# Patient Record
Sex: Female | Born: 1965 | Race: White | Hispanic: No | Marital: Single | Smoking: Never smoker
Health system: Southern US, Community
[De-identification: ages and names within clinical notes are randomized; demographics above are authoritative.]

## PROBLEM LIST (undated history)

## (undated) ENCOUNTER — Emergency Department (HOSPITAL_COMMUNITY): Admission: EM | Discharge status: Absent without leave | Payer: BC Managed Care – PPO

## (undated) DIAGNOSIS — G43909 Migraine, unspecified, not intractable, without status migrainosus: Secondary | ICD-10-CM

## (undated) DIAGNOSIS — Z975 Presence of (intrauterine) contraceptive device: Secondary | ICD-10-CM

## (undated) DIAGNOSIS — D649 Anemia, unspecified: Secondary | ICD-10-CM

## (undated) DIAGNOSIS — R7303 Prediabetes: Secondary | ICD-10-CM

## (undated) DIAGNOSIS — C439 Malignant melanoma of skin, unspecified: Secondary | ICD-10-CM

## (undated) DIAGNOSIS — E039 Hypothyroidism, unspecified: Secondary | ICD-10-CM

## (undated) HISTORY — DX: Hypothyroidism, unspecified: E03.9

## (undated) HISTORY — DX: Prediabetes: R73.03

## (undated) HISTORY — DX: Anemia, unspecified: D64.9

## (undated) HISTORY — PX: KNEE SURGERY: SHX244

## (undated) HISTORY — DX: Presence of (intrauterine) contraceptive device: Z97.5

## (undated) HISTORY — PX: HAND SURGERY: SHX662

## (undated) HISTORY — PX: OTHER SURGICAL HISTORY: SHX169

## (undated) HISTORY — DX: Malignant melanoma of skin, unspecified: C43.9

## (undated) HISTORY — DX: Migraine, unspecified, not intractable, without status migrainosus: G43.909

---

## 1998-03-21 ENCOUNTER — Other Ambulatory Visit: Admission: RE | Admit: 1998-03-21 | Discharge: 1998-03-21 | Payer: Self-pay | Admitting: Obstetrics & Gynecology

## 1999-08-17 ENCOUNTER — Ambulatory Visit (HOSPITAL_COMMUNITY): Admission: RE | Admit: 1999-08-17 | Discharge: 1999-08-17 | Payer: Self-pay | Admitting: Obstetrics & Gynecology

## 1999-10-24 ENCOUNTER — Other Ambulatory Visit: Admission: RE | Admit: 1999-10-24 | Discharge: 1999-10-24 | Payer: Self-pay | Admitting: Obstetrics & Gynecology

## 2000-11-29 ENCOUNTER — Other Ambulatory Visit: Admission: RE | Admit: 2000-11-29 | Discharge: 2000-11-29 | Payer: Self-pay | Admitting: Obstetrics & Gynecology

## 2001-12-18 ENCOUNTER — Other Ambulatory Visit: Admission: RE | Admit: 2001-12-18 | Discharge: 2001-12-18 | Payer: Self-pay | Admitting: Obstetrics & Gynecology

## 2003-12-07 ENCOUNTER — Ambulatory Visit (HOSPITAL_BASED_OUTPATIENT_CLINIC_OR_DEPARTMENT_OTHER): Admission: RE | Admit: 2003-12-07 | Discharge: 2003-12-07 | Payer: Self-pay | Admitting: Orthopedic Surgery

## 2004-01-03 ENCOUNTER — Other Ambulatory Visit: Admission: RE | Admit: 2004-01-03 | Discharge: 2004-01-03 | Payer: Self-pay | Admitting: Physician Assistant

## 2005-02-19 ENCOUNTER — Other Ambulatory Visit: Admission: RE | Admit: 2005-02-19 | Discharge: 2005-02-19 | Payer: Self-pay | Admitting: Gynecology

## 2006-04-08 ENCOUNTER — Other Ambulatory Visit: Admission: RE | Admit: 2006-04-08 | Discharge: 2006-04-08 | Payer: Self-pay | Admitting: Gynecology

## 2006-12-09 ENCOUNTER — Ambulatory Visit (HOSPITAL_COMMUNITY): Admission: RE | Admit: 2006-12-09 | Discharge: 2006-12-10 | Payer: Self-pay | Admitting: Orthopedic Surgery

## 2007-05-21 ENCOUNTER — Other Ambulatory Visit: Admission: RE | Admit: 2007-05-21 | Discharge: 2007-05-21 | Payer: Self-pay | Admitting: Gynecology

## 2007-07-24 ENCOUNTER — Encounter: Admission: RE | Admit: 2007-07-24 | Discharge: 2007-07-24 | Payer: Self-pay | Admitting: Gynecology

## 2010-01-06 IMAGING — MG MM SCREEN MAMMOGRAM BILATERAL
4 series · 4 of 4 positions shown · non-contrast
Comparison: none

DG SCREEN MAMMOGRAM BILATERAL
Bilateral CC and MLO view(s) were taken.

DIGITAL SCREENING MAMMOGRAM WITH CAD:
There are scattered fibroglandular densities.  No masses or malignant type calcifications are 
identified.  Compared with prior studies.

[R CC]
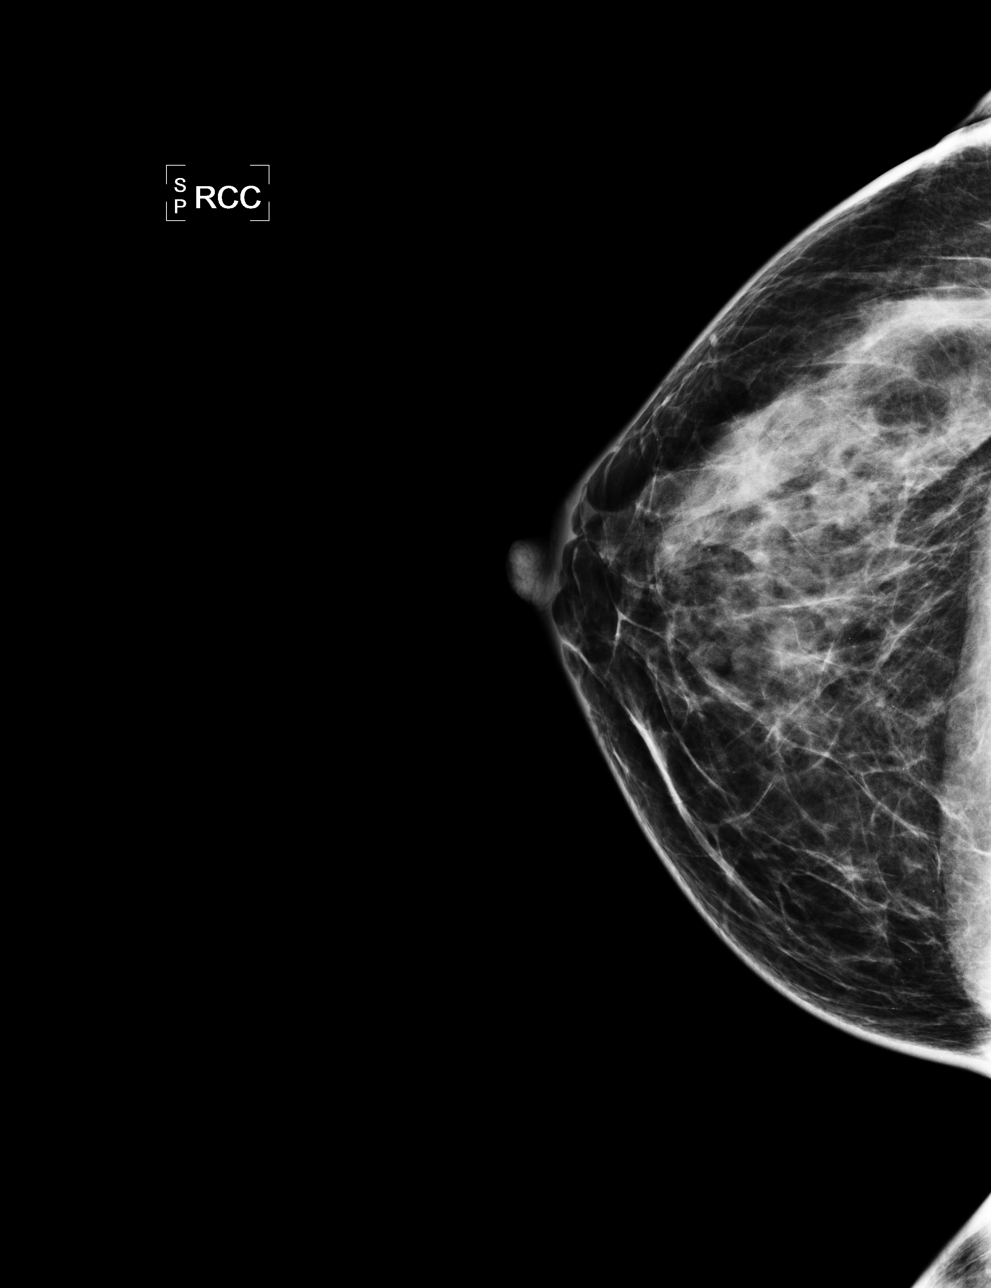

[L CC]
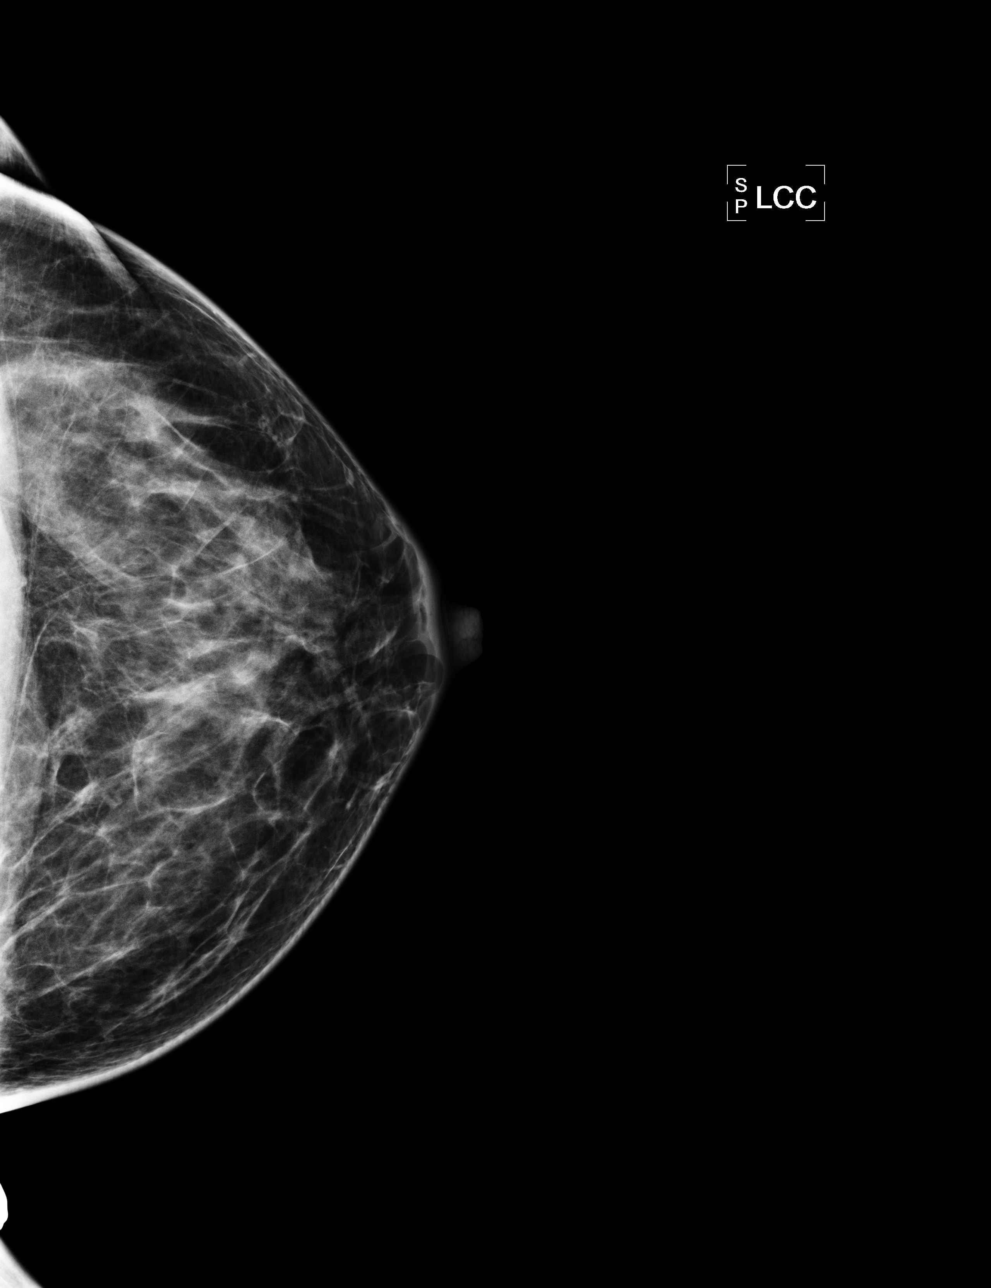

[L MLO]
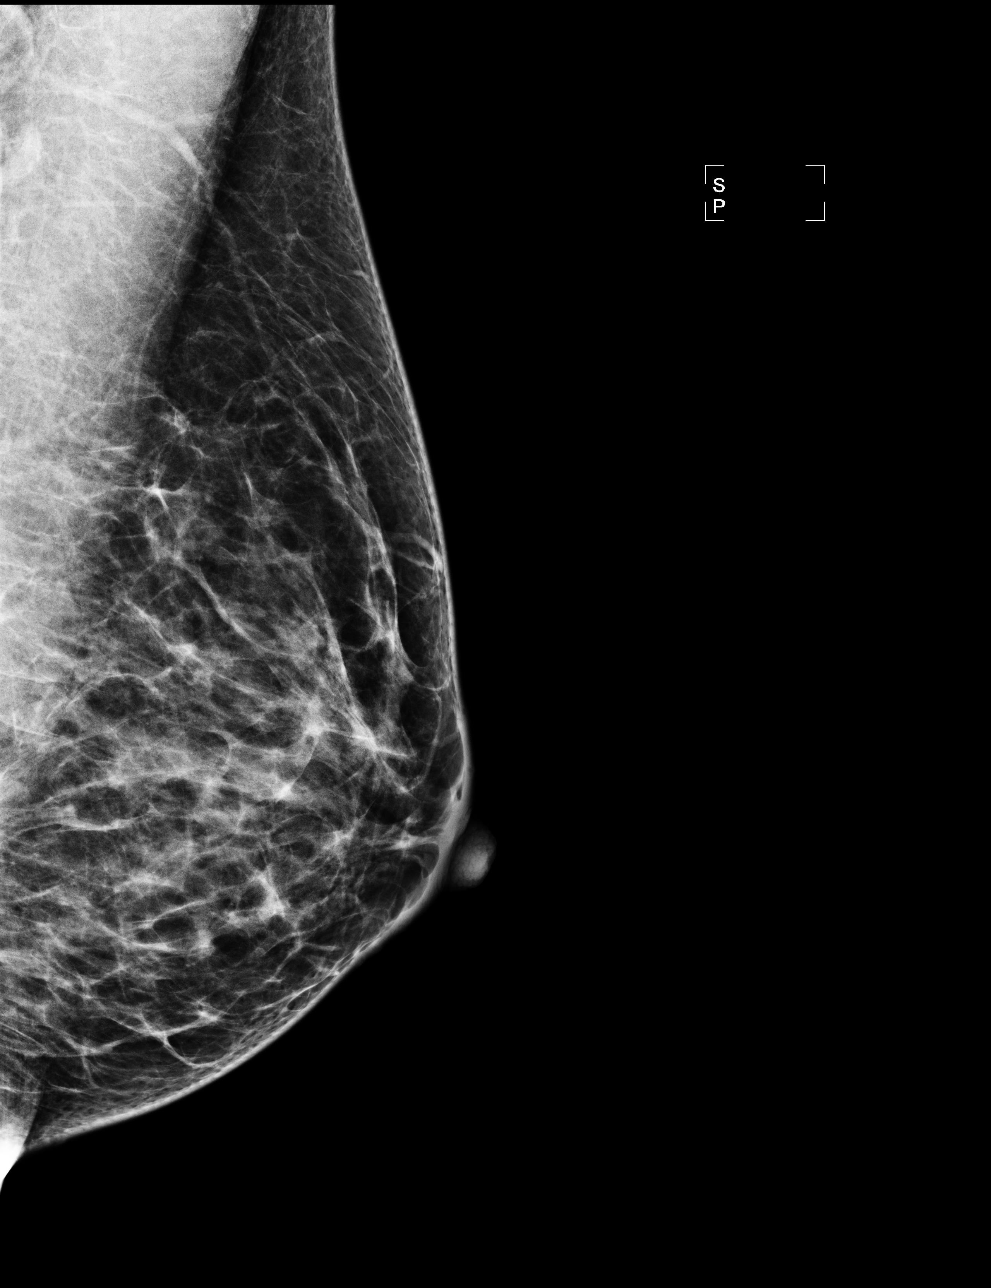

[R MLO]
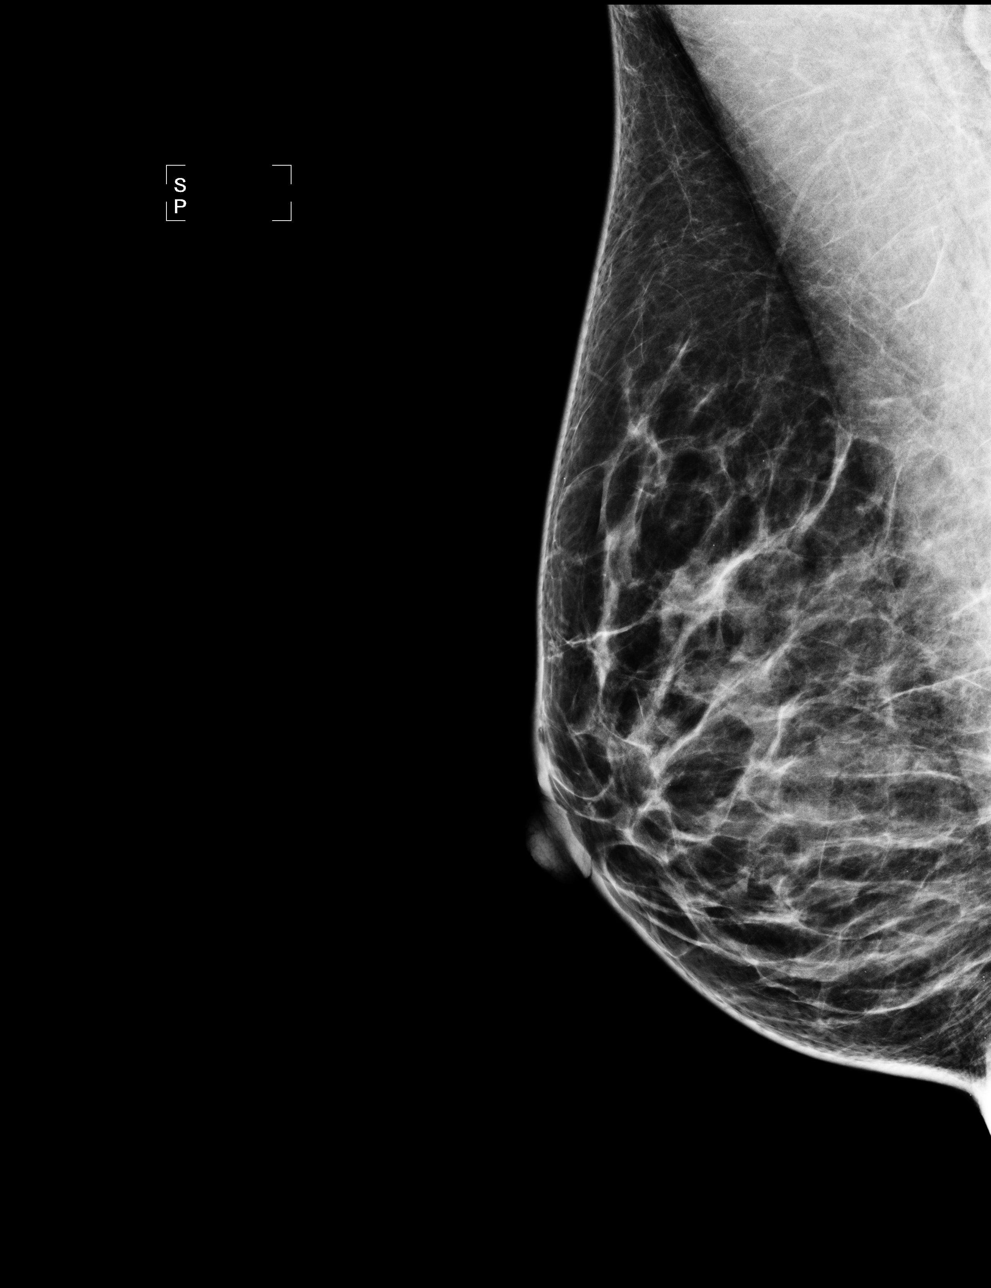

[4 of 4 positions shown; findings below may reference images not displayed]

IMPRESSION: No specific mammographic evidence of malignancy.  Next screening mammogram is recommended in one 
year.

ASSESSMENT: Negative - BI-RADS 1

Screening mammogram in 1 year.
ANALYZED BY COMPUTER AIDED DETECTION. , THIS PROCEDURE WAS A DIGITAL MAMMOGRAM.

## 2010-04-24 ENCOUNTER — Other Ambulatory Visit (HOSPITAL_COMMUNITY)
Admission: RE | Admit: 2010-04-24 | Discharge: 2010-04-24 | Disposition: A | Discharge status: Home / Self Care | Payer: Self-pay | Source: Ambulatory Visit | Attending: Family Medicine | Admitting: Family Medicine

## 2010-04-24 ENCOUNTER — Other Ambulatory Visit: Payer: Self-pay | Admitting: Family Medicine

## 2010-04-24 DIAGNOSIS — Z124 Encounter for screening for malignant neoplasm of cervix: Secondary | ICD-10-CM | POA: Insufficient documentation

## 2010-06-20 NOTE — Op Note (Signed)
Wendy Whitaker, ATEN              ACCOUNT NO.:  [id number]   MEDICAL RECORD NO.:  [id number]          PATIENT TYPE:  INP   LOCATION:  0002                         FACILITY:  [facility]   PHYSICIAN:  Madlyn Frankel. Charlann Boxer, M.D.  DATE OF BIRTH:  06/04/1965   DATE OF PROCEDURE:  12/09/2006  DATE OF DISCHARGE:                               OPERATIVE REPORT   PREOPERATIVE DIAGNOSIS:  Right knee medial compartment osteoarthritis.  Retained hardware right knee.  Widened scar medial aspect right knee   POSTOPERATIVE DIAGNOSIS:  Right knee medial compartment osteoarthritis.  same   PROCEDURE:  Right knee partial knee replacement.  Removal superficial  implant right knee.  Scar revision 12-15cm in length.   INSTRUMENTS USED:  Biomet Oxford knee system, size small femur, size  right A tibia, size 4 right medial insert.   SURGEON:  Madlyn Frankel. Charlann Boxer, M.D.   ASSISTANT:  Yetta Glassman. Loreta Ave, [place]   ANESTHESIA:  Derm or spinal.   COMPLICATIONS:  None.   DRAINS:  Times 1.   TOURNIQUET TIME:  70 minutes at 250 mmHg.   INDICATIONS FOR PROCEDURE:  Wendy Whitaker is a 45 year old who had history of  right knee injury playing soccer approximately 25 years ago; 22 years  ago, she had reconstruction of her PCL and MCL.  Over the years she has  had progressive worsening of her knee pain with radiographic changes of  end-stage medial compartment arthritis.  This was significantly limiting  some of her functional quality-of-life, though she had tried to remain  as active as possible.  She had failed conservative measures of  injections, anti-inflammatories, viscous supplementation--all providing  no significant long-term relief.  We discussed the surgical options in a  45 year old, active, female; and a partial knee replacement was a very  good option, based on the location of her pain.   She also had a retained surgical button from previous surgery that was  bugging her every time it got bumped.  She wished to have  that  removed.   We also discussed, preoperatively, revising her old scar.  There was a  pretty extensive approach to her previous surgery with some widening of  the scar tissue, and she wanted that excised.   Consent was obtained after reviewing risks and benefits.  <PROCEDURE IN DETAIL/>  The patient was brought to the operative theater.  Once adequate  anesthesia, preoperative antibiotics, Ancef administered, the patient  was positioned supine with her leg in a proximal thigh tourniquet.  She  was then positioned on the lateral side of the table, and had her  operative leg flexed and abducted at the hip, and placed into the Oxford  leg holder.  I checked to make sure that she had an adequate and full  range of motion that I needed for the operation prior to draping.  At  this point, her knee was prescrubbed, and prepped and draped in a  sterile fashion.   The extent of her old incision was identified and outlined.  The leg was  exsanguinated and tourniquet elevated.  Sharp dissection was carried  through her scar  to revise the scar, carried down to the subcutaneous  fat layer.  Then distally, I extended it down to remove the retained  button.  Creating small skin flaps, I was able to remove the button  without issue.  There was some flattening of the bone in this area that  I contoured down with a rasp and use of a Theatre manager.   At this point, attention was now turned to the knee.  A median  parapatellar arthrotomy was created, approximately 1-2 cm above the  proximal fold of the patella, extending distally.  She was noted to have  scar tissue on the medial side of the knee, and I could not perform a  big peel off the proximal medial tibia per protocol, and we released a  little bit off of the proximal edge further delineating the extensive  osteophytes that were noted there preoperatively.  With the knee exposed  including some synovectomy and removal of some of the  retropatellar fat  pad, I was able to now remove the osteophytes.  Attention was now  directed to removing osteophytes on the medial femoral condyle as well  is in the notch, and a small osteophyte noted on the medial aspect of  the patella.   Following the removal of the osteophytes and exposure of the knee, the  extramedullary __________  was placed on to the tibia.  A perpendicular  cut was made off the proximal tibia, removing what appeared to be  probably 3 mm of bone from the most arthritic segment of her tibia.  Reciprocating saw was used first off the medial aspect of the tibial  spine, followed by an oscillating saw to remove this segment.  The  patient was noted to have has significant anterior medial wear pattern  with preserved posterior medial cartilage.  This cut did remove a  significant portion of her osteophytes.  I did use a Baer rongeur to  remove further osteophytes along the medial side of the knee.   At this point, I checked with a size A tibial component and the 4 feeler  gauge, and the 4 slid in nicely with very little flexion-extension  indicating that it was the right size.  At this point, I used a starting  awl which helped to create a hole in the femoral femur 1 cm to the  medial, and proximal to the medial notch.  I then passed an  intramedullary rod without difficulty.  I did irrigate and suction the  canal to prevent fat embolism.  With this rod in place, I placed the jig  for drill holes for the posterior cutting block.  Once I had this  position for a size small femur, in the middle of the medial femoral  condyle, and oriented in all planes as appropriate for technique; drill  holes were made for the posterior cutting block.   The posterior cutting block was placed, and posterior cut made.  This  did remove some of the osteophytes that had been previously noted on her  lateral radiograph.  She had significant posterior medial osteophyte  that needed to  debrided.  At this point, following this cut, I placed  the 0 spigot, and milled down the femur to put the trial in.  With the  trial small femur in place, I did remove more osteophytes from the  medial side of the knee as had been noted from the milling.  In flexion,  the size 4 fit very  nicely, and in 20 degrees of flexion I liked the  size 1.  The size 2 was too tight.  For this reason, I went ahead and  placed the respigot in place and 1 milled down.  At this point, I used  the rongeur to remove the central notch and central area bone, and then  the osteotomes removed the remaining osteophytes and bone from the  milling.  I then redid a trial.  I was very happy with a size 4 with the  feeler  gauge in both flexion-extension of the knee, it came out to full  extension.  At this point, it was stable with a couple of millimeters of  play when testing the medial and collateral ligament at 20 degrees  flexion.   At this point, I removed all trial components and used the posterior  osteotome to remove the osteophyte.  This was obviously palpable, and  upon use of the chiller, the chisel, I was able to remove this  osteophyte, and could feel a significant difference.  I removed the  osteophytes with a curette and pituitary rongeur.   There appeared to be a small bit of calcification within the capsule.  I  did not go after this due to the location.  At this point, final  preparation of the tibia was carried out with a size 8 tibial tray held  in position.  I then pinned it and used a reciprocating saw to create  the channel.  I used the tibial gouge to remove this bone.  With the  keel trial in place, and a size small femur, I trialed with a 4-lipped  poly.  Again, the knee felt very stable to me from extension-to-flexion.  In 20 degrees of extension she had a little bit of play at the medial  side of knee as per protocol.  I did not think that I was overstuffing  her with this.   At  this point, all trial components were removed.  I used a small drill  to drill holes into the sclerotic femur and proximal tibia.  I then  irrigated the wound and may my final debridements.  Please note that a  large piece of meniscus was removed during the procedure.  At this  point, the cement was mixed in a 2-stage technique with the tibia side  first.  Once the cement was ready, and final components were open, the  tibia components were cemented in place with a trial femur placed with  the 4 feeler gauge.  The knee was brought to 45 degrees of flexion, held  with pressure, and extruded cement was removed.  After approximately 7  minutes a second batch of cement was made for the femur.  The femoral  component was cemented into position, size small, and again held with a  4 feeler gauge at 45 degrees of flexion.  Excessive cement was removed.  Once the cement had cured and the excessive cement removed, with the use  of the console, I re-examined the posterior aspect of the knee; and felt  that there was no significant amount of cement that needed to be  debrided.  On the medial side of the knee there was no cement that was  visualized.  Once I was satisfied that this was it, I retrialed and was  satisfied with a size 4 insert.  Again, it popped in appropriately; and  was stable from extension-to-flexion at 20 degrees of extension and 1-2  mm of play with valgus loading that indicated for the procedure.  This  brought the final 4 small right medial insert placed without difficulty.  The knee was irrigated throughout the case; and, again, at this point.  I injected the knee with 30 mL of 1/4% Marcaine with epinephrine and 1  mL of Toradol.  The medium Hemovac drain was placed deep.  Tourniquet  was let down at 70 minutes.  The extensor mechanism was then  reapproximated using #1 Vicryl.  I reapproximated the subcu layers with  a 3-0 Vicryl, and then used the 4-0 Monocryl to close up the  scar  revision.   At the end of the procedure the wound was clean, dry, and dressed  sterilely with Steri-Strips and a sterile dressing.  She was brought to  the recovery room in a knee immobilizer with ice, in stable condition.      Madlyn Frankel Charlann Boxer, M.D.  Electronically Signed     MDO/MEDQ  D:  12/09/2006  T:  12/09/2006  Job:  [id number]

## 2010-06-23 NOTE — H&P (Signed)
[name], [name]              ACCOUNT NO.:  [id number]   MEDICAL RECORD NO.:  [id number]         PATIENT TYPE:  LINP   LOCATION:                               FACILITY:  [facility]   PHYSICIAN:  Madlyn Frankel. Charlann Boxer, M.D.  DATE OF BIRTH:  03/15/1965   DATE OF ADMISSION:  12/09/2006  DATE OF DISCHARGE:                              HISTORY & PHYSICAL   PROCEDURE:  Left unicondylar knee arthroplasty.   CHIEF COMPLAINTS:  Right knee pain.   HISTORY OF PRESENT ILLNESS:  [name] is a very pleasant 45 year old  female with a history persistent progressive right knee pain secondary  to osteoarthritis with a history of multi-ligament repair with  meniscectomy in the past.  She had been refractory to all conservative  treatments included anti-inflammatories, cortisone injections and  viscous supplementation.  She is a very healthy female and has been  assessed and cleared for surgery prior by Dr. Charlann Boxer.   PAST MEDICAL HISTORY:  Includes osteoarthritis and migraines.   PAST SURGICAL HISTORY:  Knee multi-ligament repair.   FAMILY MEDICAL HISTORY:  Fibromyalgia, hyperthyroidism, hypertension.   SOCIAL HISTORY:  Married.  Primary caregiver will be very helpful  husband afterwards.   DRUG ALLERGIES:  NO KNOWN DRUG ALLERGIES.   MEDICATIONS:  1. Topamax unknown dosage amount, takes 1-1/2 tablets every night for      migraines.  2. Maxalt 5 mg p.o. p.r.n. migraines,  3. Sonata p.r.n. every night insomnia.  4. Celebrex 200 mg p.o. b.i.d. for 2 days.  5. Multivitamin.   See HPI.   PHYSICAL EXAMINATION:  Pulse 64, respirations 18, blood pressure 120/78.  GENERAL:  Awake, alert and oriented, well-developed, well-nourished in  no acute distress.  NECK:  No bruits.  CHEST/LUNGS:  Clear to auscultation bilaterally.  HEART:  Regular rate and rhythm without gallops, clicks, rubs or  murmurs.  ABDOMEN:  Soft, nontender, nondistended.  Bowel sounds present.  GENITOURINARY:  Deferred.  EXTREMITIES:  Right knee has mild flexion fracture with flexion back to  120 degrees with pain.  SKIN:  There is a palpable button over the tibial tuberosity with old  incision scar over the more medial anterior portion of her knee.  NEUROLOGIC:  Intact distal sensibilities.   LABORATORY DATA:  EKG and chest x-ray are pending presurgical testing at  [facility].   IMPRESSION:  1. Osteoarthritis.  2. Migraines.   PLAN OF ACTION:  Right unicondylar knee arthroplasty at [facility] December 09, 2006 by surgeon Dr. Durene Whitaker.  Risks and  complications were discussed.   Perioperative Celebrex medication provided at time of history and  physical and will continue for 2 days after surgery as well.     ______________________________  Yetta Glassman. Loreta Ave, [place]      Madlyn Frankel. Charlann Boxer, M.D.  Electronically Signed    BLM/MEDQ  D:  11/21/2006  T:  11/22/2006  Job:  [id number]

## 2010-06-23 NOTE — Op Note (Signed)
[facility]  Patient:    Wendy Whitaker, Wendy Whitaker                     MRN: [id number] Proc. Date: 08/17/99 Adm. Date:  [id number] Attending:  Minette Headland                           Operative Report  PREOPERATIVE DIAGNOSIS:       Unplanned intrauterine pregnancy after failed sterilization procedure.  POSTOPERATIVE DIAGNOSIS:      Unplanned intrauterine pregnancy after failed sterilization procedure.  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  OPERATION:                    D&E.  SURGEON:                      Freddy Finner, M.D.  ASSISTANT:  ANESTHESIA:  ESTIMATED BLOOD LOSS:  INDICATIONS:                  The patient is a 45 year old who has an unplanned  pregnancy which occurred after vasectomy in her husband before the final counts  were in.  She has considered all of her options and feels that termination is the appropriate course for her and her family.  She is admitted now for that purpose.  DESCRIPTION OF PROCEDURE:     She is admitted on the morning of surgery and brought to the operating room.  Placed under adequate intravenous sedation and placed in the dorsal lithotomy position.  Betadine prep of the perineum and vagina were carried out.  Paracervical block was placed using a total of 10 cc of 1% Xylocaine. Anterior cervical lip was grasped with a single tooth tenaculum.  The cervix was progressively dilated to 25 with Pratts.  A 7 mm curved suction cannula was introduced and aspiration produced obvious products of conception.  This was continued until it was felt that the cavity was evacuated.  Gentle sharp curettage, exploration with Randall stone forceps and repeat vacuum aspiration confirmed complete evacuation of the cavity.  The patient tolerated the procedure well and was taken to the recovery room in good condition.  She will be discharged in the immediate postoperative period for follow-up in the office in  approximately two  weeks. DD:  08/17/99 TD:  08/17/99 Job: [id number]

## 2010-06-23 NOTE — Op Note (Signed)
NAMEGUSTIE, BOBB NO.:  [id number]   MEDICAL RECORD NO.:  [id number]          PATIENT TYPE:  AMB   LOCATION:  DSC                          FACILITY:  [facility]   PHYSICIAN:  Leonides Grills, M.D.     DATE OF BIRTH:  1965/12/21   DATE OF PROCEDURE:  12/07/2003  DATE OF DISCHARGE:                                 OPERATIVE REPORT   REFERRING PHYSICIAN:  Erasmo Leventhal, M.D.   PREOPERATIVE DIAGNOSIS:  Bilateral first tarsometatarsal joint spurs.   POSTOPERATIVE DIAGNOSIS:  Bilateral first tarsometatarsal joint spurs.   OPERATION:  Bilateral excision first tarsometatarsal joint spurs.   ANESTHESIA:  General endotracheal tube anesthesia.   SURGEON:  Leonides Grills, M.D.   ASSISTANT:  Lianne Cure, P.A.C.   ESTIMATED BLOOD LOSS:  Minimal.   TOURNIQUET TIME:  Approximately 10 minutes on the left and 20 minutes on the  right.   DISPOSITION:  Stable to PR.   INDICATIONS FOR PROCEDURE:  This is a 45 year old female who has had  longstanding dorsal medial mid foot pain that was interfering with her life  __________ she wants to do with numbness in her first web space dorsal  aspect of her great toe.  Pain was interfering with her life.  She consents  to the above procedure.  All risks which include infection, nerve and vessel  injury, recurrence, persistent numbness and possible neuroma were all  explained.  Questions were encouraged and answered.   DESCRIPTION OF PROCEDURE:  The patient was brought to the operating room and  placed in supine position after adequate general endotracheal tube  anesthesia  was administered as well as the Ancef 1 g IV piggyback.  Bilateral lower extremities were prepped and draped in sterile manner,  proximally placed thigh tourniquet.  Limbs were gravity exsanguinated.  Tourniquet was elevated 290 mmHg.  Starting on the left side, a longitudinal  incision was made just later to the EHL tendon.  Dissection was carried down   through skin.  Hemostasis was obtained.  Neurovascular structures were left  laterally and were untouched.  We then retracted EHL tendon, staying out of  it sheath, approached the spur from the medial side.  We then elevated the  capsule off the spur and retracted this out of harm's way with a curved  quarter inch osteotome.  We then removed the spur in its entirety on both  the medial cuneiform and base of first metatarsal.  Once this was removed  and rounded off with rongeurs, this was palpated through the skin and was  adequately decompressed.  The area was copiously irrigated with normal  saline.  Exposed  bones were covered with bone wax.  Tourniquet was deflated.  Hemostasis was  obtained.  Skin was closed with 4-0 nylon suture.  We then did the same  exact procedure on the right side.  Once wounds were closed, the sterile  dressings were applied.  Hard sole shoe was applied.  The patient was stable  to PR.       PB/MEDQ  D:  12/07/2003  T:  12/07/2003  Job:  [id number]   cc:   Erasmo Leventhal, M.D.  [facility]  [address]  [address]  [place]  [place] [address]  Fax: [phone]

## 2010-11-14 LAB — TYPE AND SCREEN
ABO/RH(D): AB POS
Antibody Screen: POSITIVE
Donor AG Type: NEGATIVE
PT AG Type: NEGATIVE

## 2010-11-14 LAB — BASIC METABOLIC PANEL
CO2: 24
Calcium: 8.8
Creatinine, Ser: 0.79
Glucose, Bld: 111 — ABNORMAL HIGH

## 2010-11-14 LAB — CBC
Hemoglobin: 11.9 — ABNORMAL LOW
MCHC: 34.5
MCV: 87.7
RDW: 13.3

## 2010-11-14 LAB — PROTIME-INR
INR: 1
Prothrombin Time: 13.3

## 2010-11-15 LAB — HEMOGLOBIN AND HEMATOCRIT, BLOOD
HCT: 38.4
Hemoglobin: 13.4

## 2010-11-15 LAB — URINALYSIS, ROUTINE W REFLEX MICROSCOPIC
Bilirubin Urine: NEGATIVE
Ketones, ur: NEGATIVE
Nitrite: NEGATIVE
pH: 6.5

## 2010-11-15 LAB — PREGNANCY, URINE: Preg Test, Ur: NEGATIVE

## 2012-07-29 ENCOUNTER — Other Ambulatory Visit: Payer: Self-pay | Admitting: Gynecology

## 2012-10-09 ENCOUNTER — Other Ambulatory Visit: Payer: Self-pay | Admitting: Gastroenterology

## 2013-03-03 ENCOUNTER — Telehealth: Payer: Self-pay | Admitting: Diagnostic Neuroimaging

## 2013-03-03 NOTE — Telephone Encounter (Signed)
I called patient, no answer. Apparently she has been having more headaches and would like a sooner appt. Please setup follow up in next few days or 1-2 weeks.  -VRP

## 2013-03-03 NOTE — Telephone Encounter (Signed)
Called patient back to schedule appt. On 03/05/13 at 3:00 pm, per Dr. Leta Baptist. Patient stated that she would be here for her appt. I advised the patient that if she has any other problems, questions or concerns to call the office. Patient verbalized understanding.

## 2013-03-03 NOTE — Telephone Encounter (Signed)
Pt returned call.  Apologized as she was in a meeting and had her phone on silent when the call came in.  She also stated that she is having an increase in migraines.  She is having them daily for about half the month and this has been going on the past couple of months.  Please call.

## 2013-03-05 ENCOUNTER — Ambulatory Visit (INDEPENDENT_AMBULATORY_CARE_PROVIDER_SITE_OTHER): Payer: No Typology Code available for payment source | Admitting: Diagnostic Neuroimaging

## 2013-03-05 ENCOUNTER — Encounter (INDEPENDENT_AMBULATORY_CARE_PROVIDER_SITE_OTHER): Payer: Self-pay

## 2013-03-05 ENCOUNTER — Encounter: Payer: Self-pay | Admitting: Diagnostic Neuroimaging

## 2013-03-05 VITALS — BP 106/70 | HR 60 | Temp 98.8°F | Ht 63.0 in | Wt 131.0 lb

## 2013-03-05 DIAGNOSIS — G43009 Migraine without aura, not intractable, without status migrainosus: Secondary | ICD-10-CM

## 2013-03-05 MED ORDER — PROPRANOLOL HCL 40 MG PO TABS: 40.0000 mg | ORAL_TABLET | Freq: Two times a day (BID) | ORAL | Status: DC

## 2013-03-05 NOTE — Patient Instructions (Signed)
Continue hormone therapy.  Taper topiramate off.  In 1 month, consider starting propranolol 40mg  daily for 2-4 weeks, then increase to twice a day. Monitor for low blood pressure / dizziness.

## 2013-03-05 NOTE — Progress Notes (Signed)
[facility]  PATIENT: Wendy Whitaker DOB: 06/11/1965  REFERRING CLINICIAN:  HISTORY FROM: patient  REASON FOR VISIT: follow up    HISTORICAL  CHIEF COMPLAINT:  Chief Complaint  Patient presents with  . Follow-up    migraine    HISTORY OF PRESENT ILLNESS:   UPDATE 03/05/13: Since last visit, was doing well until few months ago, when migraines increased up to 14-15 days per month. She thinks she is going through menopause, and has recently tried estrogen patch to see if symptoms and HA improve. So far, it seems to be working. Otherwise, sumatriptan and NSAIDs still help with HA control, but number of days is the main problem (i.e. running out of sumatriptan before end of month).  PRIOR HPI (04/16/12): 48 year old right-handed female, with hypothyroidism, anemia, here for evaluation of migraine headaches.  Patient reports history of migraine headaches since her teenage years. She describes global, severe throbbing headaches with mild nausea. No photophobia or phonophobia. No auras. No vision changes. Triggers include menstrual cycle and insomnia. [facility]. Currently she is well-controlled on combination of topiramate 50 mg at night and sumatriptan 100 mg as needed. Presently she is having 5-6 headache days per month. This is a significant improvement compared to a few years ago. Patient is satisfied with her current headache control.  Patient has been on higher doses of topiramate in the past. She's tried Toprol, amitriptyline, as well as a variety of NSAIDs and triptans. Family history notable for migraine in her son.  REVIEW OF SYSTEMS: Full 14 system review of systems performed and notable only for anemia, dizziness, sleepiness.  ALLERGIES: No Known Allergies  HOME MEDICATIONS: No outpatient prescriptions prior to visit.   No facility-administered medications prior to visit.    PAST MEDICAL  HISTORY: Past Medical History  Diagnosis Date  . Hypothyroid   . Chronic anemia   . Migraine     PAST SURGICAL HISTORY: Past Surgical History  Procedure Laterality Date  . Knee surgery Right     multiple x's  . Cesarean section  1988  . Hand surgery      x2    FAMILY HISTORY: History reviewed. No pertinent family history.  SOCIAL HISTORY:  History   Social History  . Marital Status: Married    Spouse Name: Jonni Sanger    Number of Children: 3  . Years of Education: Post Grad   Occupational History  .  Other    n/a   Social History Main Topics  . Smoking status: Never Smoker   . Smokeless tobacco: Never Used  . Alcohol Use: Yes     Comment: 1-2 alcohol drinks on the weekends  . Drug Use: Not on file  . Sexual Activity: Not on file   Other Topics Concern  . Not on file   Social History Narrative   Patient lives at home with her spouse.   Caffeine Use: 1 cup 2-3 times weekly     PHYSICAL EXAM  Filed Vitals:   03/05/13 1539  BP: 106/70  Pulse: 60  Temp: 98.8 F (37.1 C)  TempSrc: Oral  Height: $Remove'5\' 3"'yiFYzAD$  (1.6 m)  Weight: 131 lb (59.421 kg)    Not recorded    Body mass index is 23.21 kg/(m^2).  GENERAL EXAM: Patient is in no distress; well developed, nourished and groomed; neck is supple  CARDIOVASCULAR: Regular rate and rhythm, no murmurs  NEUROLOGIC: MENTAL STATUS: awake, alert, oriented to  person, place and time, recent and remote memory intact, normal attention and concentration, language fluent, comprehension intact, naming intact, fund of knowledge appropriate CRANIAL NERVE: pupils equal and reactive to light, visual fields full to confrontation, extraocular muscles intact, no nystagmus, facial sensation and strength symmetric, hearing intact, palate elevates symmetrically, uvula midline, shoulder shrug symmetric, tongue midline. MOTOR: normal bulk and tone, full strength in the BUE, BLE SENSORY: normal and symmetric to light touch COORDINATION:  finger-nose-finger  normal REFLEXES: deep tendon reflexes present and symmetric GAIT/STATION: narrow based gai    DIAGNOSTIC DATA (LABS, IMAGING, TESTING) - I reviewed patient records, labs, notes, testing and imaging myself where available.  Lab Results  Component Value Date   WBC 9.6 12/10/2006   HGB 11.9* 12/10/2006   HCT 34.5* 12/10/2006   MCV 87.7 12/10/2006   PLT 226 12/10/2006      Component Value Date/Time   NA 140 12/10/2006 0422   K 4.0 12/10/2006 0422   CL 112 12/10/2006 0422   CO2 24 12/10/2006 0422   GLUCOSE 111* 12/10/2006 0422   BUN 8 12/10/2006 0422   CREATININE 0.79 12/10/2006 0422   CALCIUM 8.8 12/10/2006 0422   GFRNONAA >60 12/10/2006 0422   GFRAA  Value: >60        The eGFR has been calculated using the MDRD equation. This calculation has not been validated in all clinical 12/10/2006 0422   No results found for this basename: CHOL, HDL, LDLCALC, LDLDIRECT, TRIG, CHOLHDL   No results found for this basename: HGBA1C   No results found for this basename: VITAMINB12   No results found for this basename: TSH      ASSESSMENT AND PLAN  48 y.o. year old female here with migraines without aura. Increasing frequency of migraine seems to be related to onset of menopause, and seems to be improved with estrogen therapy. Also wants to try something different than TPX for prevention, because if memory complaints.   PLAN: - taper TPX off over next 1-2 weeks - continue hormone therapy for next few months, and see if migraines improve - consider propranolol 40mg  daily x 2-4 weeks, then BID, for migraine prevention; may start in 1-2 months, after hormone therapy stabilized - continue sumatriptan prn   Return in about 3 months (around 06/03/2013).    Penni Bombard, MD 07/22/735, 1:06 PM Certified in Neurology, Neurophysiology and Neuroimaging  [facility] [address] [place], [place] [address] [phone]

## 2013-04-23 ENCOUNTER — Other Ambulatory Visit: Payer: Self-pay | Admitting: Diagnostic Neuroimaging

## 2013-05-11 ENCOUNTER — Ambulatory Visit: Payer: Self-pay | Admitting: Diagnostic Neuroimaging

## 2013-05-18 ENCOUNTER — Encounter: Payer: Self-pay | Admitting: Diagnostic Neuroimaging

## 2013-05-18 ENCOUNTER — Ambulatory Visit (INDEPENDENT_AMBULATORY_CARE_PROVIDER_SITE_OTHER): Payer: No Typology Code available for payment source | Admitting: Diagnostic Neuroimaging

## 2013-05-18 VITALS — BP 95/63 | HR 64 | Ht 63.0 in | Wt 131.0 lb

## 2013-05-18 DIAGNOSIS — G43009 Migraine without aura, not intractable, without status migrainosus: Secondary | ICD-10-CM

## 2013-05-18 MED ORDER — SUMATRIPTAN SUCCINATE 100 MG PO TABS: 100.0000 mg | ORAL_TABLET | ORAL | Status: DC | PRN

## 2013-05-18 NOTE — Progress Notes (Signed)
[facility]  PATIENT: Wendy Whitaker DOB: 12/15/1965  REFERRING CLINICIAN:  HISTORY FROM: patient  REASON FOR VISIT: follow up   HISTORICAL  CHIEF COMPLAINT:  Chief Complaint  Patient presents with  . Follow-up    migraine    HISTORY OF PRESENT ILLNESS:   UPDATE 05/18/13: Since last visit, HA frequency is back down to 7-8 per month. Has not tried propranolol yet. She is satisifed with current migraine freq and control with sumatriptan.  UPDATE 03/05/13: Since last visit, was doing well until few months ago, when migraines increased up to 14-15 days per month. She thinks she is going through menopause, and has recently tried estrogen patch to see if symptoms and HA improve. So far, it seems to be working. Otherwise, sumatriptan and NSAIDs still help with HA control, but number of days is the main problem (i.e. running out of sumatriptan before end of month).   PRIOR HPI (04/16/12): 48 year old right-handed female, with hypothyroidism, anemia, here for evaluation of migraine headaches. Patient reports history of migraine headaches since her teenage years. She describes global, severe throbbing headaches with mild nausea. No photophobia or phonophobia. No auras. No vision changes. Triggers include menstrual cycle and insomnia. [facility]. Currently she is well-controlled on combination of topiramate 50 mg at night and sumatriptan 100 mg as needed. Presently she is having 5-6 headache days per month. This is a significant improvement compared to a few years ago. Patient is satisfied with her current headache control. Patient has been on higher doses of topiramate in the past. She's tried Toprol, amitriptyline, as well as a variety of NSAIDs and triptans. Family history notable for migraine in her son.  REVIEW OF SYSTEMS: Full 14 system review of systems performed and notable only for headache. Otherwise  negative.  ALLERGIES: No Known Allergies  HOME MEDICATIONS: Outpatient Prescriptions Prior to Visit  Medication Sig Dispense Refill  . celecoxib (CELEBREX) 100 MG capsule Take 100 mg by mouth as needed.       . ferrous sulfate 325 (65 FE) MG EC tablet Take 325 mg by mouth daily.      Marland Kitchen levothyroxine (SYNTHROID, LEVOTHROID) 50 MCG tablet Take 1 tablet by mouth at bedtime.      . SUMAtriptan (IMITREX) 100 MG tablet 1 TAB AS NEEDED FOR MIGRAINE MAXIMUM. 2 TABS PER 24 HOURS MAXIMUM 8 TABS PER MONTH.  24 tablet  1  . propranolol (INDERAL) 40 MG tablet Take 1 tablet (40 mg total) by mouth 2 (two) times daily.  60 tablet  6  . estradiol (VIVELLE-DOT) 0.1 MG/24HR patch Place 1 patch onto the skin 2 (two) times a week.       No facility-administered medications prior to visit.    PAST MEDICAL HISTORY: Past Medical History  Diagnosis Date  . Hypothyroid   . Chronic anemia   . Migraine     PAST SURGICAL HISTORY: Past Surgical History  Procedure Laterality Date  . Knee surgery Right     multiple x's  . Cesarean section  1988  . Hand surgery      x2    FAMILY HISTORY: Family History  Problem Relation Age of Onset  . Migraines Son     SOCIAL HISTORY:  History   Social History  . Marital Status: Married    Spouse Name: Jonni Sanger    Number of Children: 3  . Years of Education: Post Grad   Occupational History  .  Other    n/a   Social History Main Topics  . Smoking status: Never Smoker   . Smokeless tobacco: Never Used  . Alcohol Use: Yes     Comment: 1-2 alcohol drinks on the weekends  . Drug Use: Not on file  . Sexual Activity: Not on file   Other Topics Concern  . Not on file   Social History Narrative   Patient lives at home with her spouse.   Caffeine Use: 1 cup 2-3 times weekly     PHYSICAL EXAM  Filed Vitals:   05/18/13 0846  BP: 95/63  Pulse: 64  Height: 5\' 3"  (1.6 m)  Weight: 131 lb (59.421 kg)    Not recorded    Body mass index is 23.21  kg/(m^2).  GENERAL EXAM: Patient is in no distress; well developed, nourished and groomed; neck is supple  CARDIOVASCULAR: Regular rate and rhythm, no murmurs, no carotid bruits  NEUROLOGIC: MENTAL STATUS: awake, alert, oriented to person, place and time, recent and remote memory intact, normal attention and concentration, language fluent, comprehension intact, naming intact, fund of knowledge appropriate CRANIAL NERVE: no papilledema on fundoscopic exam, pupils equal and reactive to light, visual fields full to confrontation, extraocular muscles intact, no nystagmus, facial sensation and strength symmetric, hearing intact, palate elevates symmetrically, uvula midline, shoulder shrug symmetric, tongue midline. MOTOR: normal bulk and tone, full strength in the BUE, BLE SENSORY: normal and symmetric to light touch COORDINATION: finger-nose-finger, fine finger movements normal REFLEXES: deferred GAIT/STATION: narrow based gait   DIAGNOSTIC DATA (LABS, IMAGING, TESTING) - I reviewed patient records, labs, notes, testing and imaging myself where available.  Lab Results  Component Value Date   WBC 9.6 12/10/2006   HGB 11.9* 12/10/2006   HCT 34.5* 12/10/2006   MCV 87.7 12/10/2006   PLT 226 12/10/2006      Component Value Date/Time   NA 140 12/10/2006 0422   K 4.0 12/10/2006 0422   CL 112 12/10/2006 0422   CO2 24 12/10/2006 0422   GLUCOSE 111* 12/10/2006 0422   BUN 8 12/10/2006 0422   CREATININE 0.79 12/10/2006 0422   CALCIUM 8.8 12/10/2006 0422   GFRNONAA >60 12/10/2006 0422   GFRAA  Value: >60        The eGFR has been calculated using the MDRD equation. This calculation has not been validated in all clinical 12/10/2006 0422   No results found for this basename: CHOL, HDL, LDLCALC, LDLDIRECT, TRIG, CHOLHDL   No results found for this basename: HGBA1C   No results found for this basename: VITAMINB12   No results found for this basename: TSH     ASSESSMENT AND PLAN  48 y.o. year old  female here with migraines without aura since 1980's. Increasing frequency of migraines in Nov/Dec 2014 seemed to be related to onset of menopause, and seemed to be improved with estrogen therapy. Also tapered of TPX because if memory complaints. Now at baseline HA freq of 7-8 per month.  PLAN:  - continue sumatriptan prn - if HA increase, consider propranolol 40mg  daily x 2-4 weeks, then BID, for migraine prevention  Meds ordered this encounter  Medications  . SUMAtriptan (IMITREX) 100 MG tablet    Sig: Take 1 tablet (100 mg total) by mouth as needed for migraine or headache. May repeat in 2 hours if headache persists or recurs. Max 2 tabs per 24 hours. Max 8 tabs per month.    Dispense:  24 tablet    Refill:  4  Return in about 6 months (around 11/17/2013), or if symptoms worsen or fail to improve.    Penni Bombard, MD 10/28/3005, 6:22 AM Certified in Neurology, Neurophysiology and Neuroimaging  [facility] [address], [place], [place] [address] [phone]

## 2013-05-18 NOTE — Patient Instructions (Signed)
Continue current medications. 

## 2013-07-06 DIAGNOSIS — Z975 Presence of (intrauterine) contraceptive device: Secondary | ICD-10-CM

## 2013-07-06 HISTORY — DX: Presence of (intrauterine) contraceptive device: Z97.5

## 2013-08-04 ENCOUNTER — Other Ambulatory Visit: Payer: Self-pay | Admitting: Gynecology

## 2013-11-17 ENCOUNTER — Encounter: Payer: Self-pay | Admitting: Diagnostic Neuroimaging

## 2013-11-17 ENCOUNTER — Ambulatory Visit (INDEPENDENT_AMBULATORY_CARE_PROVIDER_SITE_OTHER): Payer: No Typology Code available for payment source | Admitting: Diagnostic Neuroimaging

## 2013-11-17 VITALS — BP 104/63 | HR 72 | Ht 63.0 in | Wt 132.4 lb

## 2013-11-17 DIAGNOSIS — G43009 Migraine without aura, not intractable, without status migrainosus: Secondary | ICD-10-CM | POA: Insufficient documentation

## 2013-11-17 MED ORDER — DICLOFENAC POTASSIUM(MIGRAINE) 50 MG PO PACK: 50.0000 mg | PACK | ORAL | Status: DC | PRN

## 2013-11-17 MED ORDER — AMITRIPTYLINE HCL 25 MG PO TABS: 25.0000 mg | ORAL_TABLET | Freq: Every day | ORAL | Status: DC

## 2013-11-17 NOTE — Progress Notes (Signed)
[facility]  PATIENT: Wendy Whitaker DOB: 31-Aug-1965  REFERRING CLINICIAN:  HISTORY FROM: patient  REASON FOR VISIT: follow up   HISTORICAL  CHIEF COMPLAINT:  Chief Complaint  Patient presents with  . Follow-up    migraine    HISTORY OF PRESENT ILLNESS:   UPDATE 11/17/13: Since last visit, continues with 7-8 migraines per month. Sometimes runs out of sumatriptan before end of month. Reluctant to try propranolol due to side effects of exercise intolerance.   UPDATE 05/18/13: Since last visit, HA frequency is back down to 7-8 per month. Has not tried propranolol yet. She is satisifed with current migraine freq and control with sumatriptan.  UPDATE 03/05/13: Since last visit, was doing well until few months ago, when migraines increased up to 14-15 days per month. She thinks she is going through menopause, and has recently tried estrogen patch to see if symptoms and HA improve. So far, it seems to be working. Otherwise, sumatriptan and NSAIDs still help with HA control, but number of days is the main problem (i.e. running out of sumatriptan before end of month).   PRIOR HPI (04/16/12): 48 year old right-handed female, with hypothyroidism, anemia, here for evaluation of migraine headaches. Patient reports history of migraine headaches since her teenage years. She describes global, severe throbbing headaches with mild nausea. No photophobia or phonophobia. No auras. No vision changes. Triggers include menstrual cycle and insomnia. [facility]. Currently she is well-controlled on combination of topiramate 50 mg at night and sumatriptan 100 mg as needed. Presently she is having 5-6 headache days per month. This is a significant improvement compared to a few years ago. Patient is satisfied with her current headache control. Patient has been on higher doses of topiramate in the past. She's tried Toprol, amitriptyline, as  well as a variety of NSAIDs and triptans. Family history notable for migraine in her son.  REVIEW OF SYSTEMS: Full 14 system review of systems performed and notable only for headache and joint pain.   ALLERGIES: No Known Allergies  HOME MEDICATIONS: Outpatient Prescriptions Prior to Visit  Medication Sig Dispense Refill  . celecoxib (CELEBREX) 100 MG capsule Take 100 mg by mouth as needed.       Marland Kitchen levothyroxine (SYNTHROID, LEVOTHROID) 50 MCG tablet Take 1 tablet by mouth at bedtime.      . SUMAtriptan (IMITREX) 100 MG tablet Take 1 tablet (100 mg total) by mouth as needed for migraine or headache. May repeat in 2 hours if headache persists or recurs. Max 2 tabs per 24 hours. Max 8 tabs per month.  24 tablet  4  . ferrous sulfate 325 (65 FE) MG EC tablet Take 325 mg by mouth daily.      . propranolol (INDERAL) 40 MG tablet Take 1 tablet (40 mg total) by mouth 2 (two) times daily.  60 tablet  6   No facility-administered medications prior to visit.    PAST MEDICAL HISTORY: Past Medical History  Diagnosis Date  . Hypothyroid   . Chronic anemia   . Migraine     PAST SURGICAL HISTORY: Past Surgical History  Procedure Laterality Date  . Knee surgery Right     multiple x's  . Cesarean section  1988  . Hand surgery      x2    FAMILY HISTORY: Family History  Problem Relation Age of Onset  . Migraines Son     SOCIAL HISTORY:  History   Social  History  . Marital Status: Married    Spouse Name: Jonni Sanger    Number of Children: 3  . Years of Education: Post Grad   Occupational History  .  Other    n/a   Social History Main Topics  . Smoking status: Never Smoker   . Smokeless tobacco: Never Used  . Alcohol Use: Yes     Comment: 1-2 alcohol drinks on the weekends  . Drug Use: Not on file  . Sexual Activity: Not on file   Other Topics Concern  . Not on file   Social History Narrative   Patient lives at home with her spouse.   Caffeine Use: 1 cup 2-3 times weekly      PHYSICAL EXAM  Filed Vitals:   11/17/13 1438  BP: 104/63  Pulse: 72  Height: $Remove'5\' 3"'BnKhRhS$  (1.6 m)  Weight: 132 lb 6.4 oz (60.056 kg)    Not recorded    Body mass index is 23.46 kg/(m^2).  GENERAL EXAM: Patient is in no distress; well developed, nourished and groomed; neck is supple  CARDIOVASCULAR: Regular rate and rhythm, no murmurs, no carotid bruits  NEUROLOGIC: MENTAL STATUS: awake, alert, language fluent, comprehension intact, naming intact, fund of knowledge appropriate CRANIAL NERVE:  pupils equal and reactive to light, visual fields full to confrontation, extraocular muscles intact, no nystagmus, facial sensation and strength symmetric, hearing intact, palate elevates symmetrically, uvula midline, shoulder shrug symmetric, tongue midline. MOTOR: normal bulk and tone, full strength in the BUE, BLE SENSORY: normal and symmetric to light touch COORDINATION: finger-nose-finger, fine finger movements normal REFLEXES: deferred GAIT/STATION: narrow based gait, TANDEM STABLE   DIAGNOSTIC DATA (LABS, IMAGING, TESTING) - I reviewed patient records, labs, notes, testing and imaging myself where available.  Lab Results  Component Value Date   WBC 9.6 12/10/2006   HGB 11.9* 12/10/2006   HCT 34.5* 12/10/2006   MCV 87.7 12/10/2006   PLT 226 12/10/2006      Component Value Date/Time   NA 140 12/10/2006 0422   K 4.0 12/10/2006 0422   CL 112 12/10/2006 0422   CO2 24 12/10/2006 0422   GLUCOSE 111* 12/10/2006 0422   BUN 8 12/10/2006 0422   CREATININE 0.79 12/10/2006 0422   CALCIUM 8.8 12/10/2006 0422   GFRNONAA >60 12/10/2006 0422   GFRAA  Value: >60        The eGFR has been calculated using the MDRD equation. This calculation has not been validated in all clinical 12/10/2006 0422   No results found for this basename: CHOL,  HDL,  LDLCALC,  LDLDIRECT,  TRIG,  CHOLHDL   No results found for this basename: HGBA1C   No results found for this basename: VITAMINB12   No results found for  this basename: TSH     ASSESSMENT AND PLAN  48 y.o. year old female here with migraines without aura since 1980's. Increasing frequency of migraines in Nov/Dec 2014 seemed to be related to onset of menopause, and seemed to be improved with estrogen therapy. Also tapered of TPX because if memory complaints. Now at baseline HA freq of 7-8 per month.   PLAN:  - continue sumatriptan prn - amitriptyline $RemoveBeforeD'25mg'DeptgxHlQRfOSq$  qhs - trial of cambia  Meds ordered this encounter  Medications  . Diclofenac Potassium (CAMBIA) 50 MG PACK    Sig: Take 50 mg by mouth as needed (for migraine; max 1 packet per day).    Dispense:  9 each    Refill:  3  . amitriptyline (ELAVIL) 25  MG tablet    Sig: Take 1 tablet (25 mg total) by mouth at bedtime.    Dispense:  30 tablet    Refill:  6   Return in about 3 months (around 02/17/2014).    Penni Bombard, MD 37/34/2876, 8:11 PM Certified in Neurology, Neurophysiology and Neuroimaging  [facility] [address] [place], [place] [address] [phone]

## 2013-11-17 NOTE — Patient Instructions (Signed)
Try cambia 1 packet as needed for breakthrough migraine.  Try amitriptyline 25mg  at bedtime for migraine prevention.

## 2014-01-13 ENCOUNTER — Other Ambulatory Visit: Payer: Self-pay | Admitting: Diagnostic Neuroimaging

## 2014-02-17 ENCOUNTER — Encounter: Payer: Self-pay | Admitting: Diagnostic Neuroimaging

## 2014-02-17 ENCOUNTER — Ambulatory Visit (INDEPENDENT_AMBULATORY_CARE_PROVIDER_SITE_OTHER): Payer: No Typology Code available for payment source | Admitting: Diagnostic Neuroimaging

## 2014-02-17 VITALS — BP 99/66 | HR 64 | Temp 97.2°F | Ht 63.0 in | Wt 139.0 lb

## 2014-02-17 DIAGNOSIS — G43009 Migraine without aura, not intractable, without status migrainosus: Secondary | ICD-10-CM

## 2014-02-17 MED ORDER — AMITRIPTYLINE HCL 25 MG PO TABS: 25.0000 mg | ORAL_TABLET | Freq: Every day | ORAL | Status: DC

## 2014-02-17 NOTE — Progress Notes (Signed)
[facility]  PATIENT: Wendy Whitaker DOB: 1966/01/19  REFERRING CLINICIAN:  HISTORY FROM: patient  REASON FOR VISIT: follow up   HISTORICAL  CHIEF COMPLAINT:  No chief complaint on file.   HISTORY OF PRESENT ILLNESS:   UPDATE 02/17/14: Since last visit, doing much better on amitriptyline. Sleep is better. HA are improved. Has had 1 or 2 months of headache freedom.  UPDATE 11/17/13: Since last visit, continues with 7-8 migraines per month. Sometimes runs out of sumatriptan before end of month. Reluctant to try propranolol due to side effects of exercise intolerance.   UPDATE 05/18/13: Since last visit, HA frequency is back down to 7-8 per month. Has not tried propranolol yet. She is satisifed with current migraine freq and control with sumatriptan.  UPDATE 03/05/13: Since last visit, was doing well until few months ago, when migraines increased up to 14-15 days per month. She thinks she is going through menopause, and has recently tried estrogen patch to see if symptoms and HA improve. So far, it seems to be working. Otherwise, sumatriptan and NSAIDs still help with HA control, but number of days is the main problem (i.e. running out of sumatriptan before end of month).   PRIOR HPI (04/16/12): 49 year old right-handed female, with hypothyroidism, anemia, here for evaluation of migraine headaches. Patient reports history of migraine headaches since her teenage years. She describes global, severe throbbing headaches with mild nausea. No photophobia or phonophobia. No auras. No vision changes. Triggers include menstrual cycle and insomnia. [facility]. Currently she is well-controlled on combination of topiramate 50 mg at night and sumatriptan 100 mg as needed. Presently she is having 5-6 headache days per month. This is a significant improvement compared to a few years ago. Patient is satisfied with her current  headache control. Patient has been on higher doses of topiramate in the past. She's tried Toprol, amitriptyline, as well as a variety of NSAIDs and triptans. Family history notable for migraine in her son.  REVIEW OF SYSTEMS: Full 14 system review of systems performed and notable only as per HPI.   ALLERGIES: No Known Allergies  HOME MEDICATIONS: Outpatient Prescriptions Prior to Visit  Medication Sig Dispense Refill  . ACZONE 5 % topical gel Apply 1 application topically daily.    Marland Kitchen amitriptyline (ELAVIL) 25 MG tablet Take 1 tablet (25 mg total) by mouth at bedtime. 30 tablet 6  . celecoxib (CELEBREX) 100 MG capsule Take 100 mg by mouth as needed.     Marland Kitchen levothyroxine (SYNTHROID, LEVOTHROID) 50 MCG tablet Take 1 tablet by mouth at bedtime.    . minocycline (MINOCIN,DYNACIN) 100 MG capsule Take 1 capsule by mouth daily.    . SUMAtriptan (IMITREX) 100 MG tablet Take 1 tablet (100 mg total) by mouth as needed for migraine or headache. May repeat in 2 hours if headache persists or recurs. Max 2 tabs per 24 hours. Max 8 tabs per month. 24 tablet 4  . tretinoin (RETIN-A) 0.1 % cream Apply 1 application topically daily.    . Diclofenac Potassium (CAMBIA) 50 MG PACK Take 50 mg by mouth as needed (for migraine; max 1 packet per day). 9 each 3  . SUMAtriptan (IMITREX) 100 MG tablet 1 TAB AS NEEDED FOR MIGRAINE MAXIMUM. 2 TABS PER 24 HOURS MAXIMUM 8 TABS PER MONTH. 24 tablet 1   No facility-administered medications prior to visit.    PAST MEDICAL HISTORY: Past Medical History  Diagnosis Date  .  Hypothyroid   . Chronic anemia   . Migraine     PAST SURGICAL HISTORY: Past Surgical History  Procedure Laterality Date  . Knee surgery Right     multiple x's  . Cesarean section  1988  . Hand surgery      x2    FAMILY HISTORY: Family History  Problem Relation Age of Onset  . Migraines Son     SOCIAL HISTORY:  History   Social History  . Marital Status: Married    Spouse Name: Jonni Sanger      Number of Children: 3  . Years of Education: Post Grad   Occupational History  .  Other    n/a   Social History Main Topics  . Smoking status: Never Smoker   . Smokeless tobacco: Never Used  . Alcohol Use: Yes     Comment: 1-2 alcohol drinks on the weekends  . Drug Use: Not on file  . Sexual Activity: Not on file   Other Topics Concern  . Not on file   Social History Narrative   Patient lives at home with her spouse.   Caffeine Use: 1 cup 2-3 times weekly     PHYSICAL EXAM  Filed Vitals:   02/17/14 1405  BP: 99/66  Pulse: 64  Temp: 97.2 F (36.2 C)  TempSrc: Oral  Height: $Remove'5\' 3"'DWrOjnb$  (1.6 m)  Weight: 139 lb (63.05 kg)    Not recorded      Body mass index is 24.63 kg/(m^2).  GENERAL EXAM: Patient is in no distress; well developed, nourished and groomed; neck is supple  CARDIOVASCULAR: Regular rate and rhythm, no murmurs  NEUROLOGIC: MENTAL STATUS: awake, alert, language fluent, comprehension intact, naming intact, fund of knowledge appropriate CRANIAL NERVE:  pupils equal and reactive to light, visual fields full to confrontation, extraocular muscles intact, no nystagmus, facial sensation and strength symmetric, hearing intact, palate elevates symmetrically, uvula midline, shoulder shrug symmetric, tongue midline. MOTOR: normal bulk and tone, full strength in the BUE, BLE SENSORY: normal and symmetric to light touch COORDINATION: finger-nose-finger, fine finger movements normal REFLEXES: deferred GAIT/STATION: narrow based gait    DIAGNOSTIC DATA (LABS, IMAGING, TESTING) - I reviewed patient records, labs, notes, testing and imaging myself where available.  Lab Results  Component Value Date   WBC 9.6 12/10/2006   HGB 11.9* 12/10/2006   HCT 34.5* 12/10/2006   MCV 87.7 12/10/2006   PLT 226 12/10/2006      Component Value Date/Time   NA 140 12/10/2006 0422   K 4.0 12/10/2006 0422   CL 112 12/10/2006 0422   CO2 24 12/10/2006 0422   GLUCOSE 111*  12/10/2006 0422   BUN 8 12/10/2006 0422   CREATININE 0.79 12/10/2006 0422   CALCIUM 8.8 12/10/2006 0422   GFRNONAA >60 12/10/2006 0422   GFRAA  12/10/2006 0422    >60        The eGFR has been calculated using the MDRD equation. This calculation has not been validated in all clinical   No results found for: CHOL No results found for: HGBA1C No results found for: VITAMINB12 No results found for: TSH   ASSESSMENT AND PLAN  49 y.o. year old female here with migraines without aura since 1980's. Increasing frequency of migraines in Nov/Dec 2014 seemed to be related to onset of menopause, and seemed to be improved with estrogen therapy. Also tapered off TPX because if memory complaints. Amitriptyline working well to help with insomnia and headache control.  PLAN:  - continue  sumatriptan prn - continue amitriptyline 62m qhs  Return in about 6 months (around 08/18/2014).    VPenni Bombard MD 10/38/3338 23:29PM Certified in Neurology, Neurophysiology and Neuroimaging  [facility] [address] [place] [phone]

## 2014-02-17 NOTE — Patient Instructions (Signed)
Continue amitriptyline

## 2014-05-10 ENCOUNTER — Other Ambulatory Visit: Payer: Self-pay | Admitting: Physician Assistant

## 2014-06-02 ENCOUNTER — Other Ambulatory Visit: Payer: Self-pay | Admitting: Dermatology

## 2014-06-06 DIAGNOSIS — C439 Malignant melanoma of skin, unspecified: Secondary | ICD-10-CM

## 2014-06-06 HISTORY — DX: Malignant melanoma of skin, unspecified: C43.9

## 2014-06-06 HISTORY — PX: SKIN SURGERY: SHX2413

## 2014-08-18 ENCOUNTER — Encounter: Payer: Self-pay | Admitting: Diagnostic Neuroimaging

## 2014-08-18 ENCOUNTER — Ambulatory Visit (INDEPENDENT_AMBULATORY_CARE_PROVIDER_SITE_OTHER): Payer: No Typology Code available for payment source | Admitting: Diagnostic Neuroimaging

## 2014-08-18 VITALS — BP 107/66 | HR 69 | Ht 63.0 in | Wt 139.8 lb

## 2014-08-18 DIAGNOSIS — G43009 Migraine without aura, not intractable, without status migrainosus: Secondary | ICD-10-CM | POA: Diagnosis not present

## 2014-08-18 MED ORDER — AMITRIPTYLINE HCL 25 MG PO TABS: 25.0000 mg | ORAL_TABLET | Freq: Every day | ORAL | Status: DC

## 2014-08-18 MED ORDER — SUMATRIPTAN SUCCINATE 100 MG PO TABS: 100.0000 mg | ORAL_TABLET | ORAL | Status: DC | PRN

## 2014-08-18 NOTE — Patient Instructions (Signed)
Continue current medications. 

## 2014-08-18 NOTE — Progress Notes (Signed)
[facility]  PATIENT: Wendy Whitaker DOB: [birth year]  REFERRING CLINICIAN:  HISTORY FROM: patient  REASON FOR VISIT: follow up   HISTORICAL  CHIEF COMPLAINT:  Chief Complaint  Patient presents with  . Migraine    rm 7  . Follow-up    HISTORY OF PRESENT ILLNESS:   UPDATE 08/18/14: Since last visit, stable. No headaches over 3 months, then no explanation had 4 migraine in last 1 month. These were controlled with sumatriptan. Patient satisfied with headache control.  UPDATE 02/17/14: Since last visit, doing much better on amitriptyline. Sleep is better. HA are improved. Has had 1 or 2 months of headache freedom.  UPDATE 11/17/13: Since last visit, continues with 7-8 migraines per month. Sometimes runs out of sumatriptan before end of month. Reluctant to try propranolol due to side effects of exercise intolerance.   UPDATE 05/18/13: Since last visit, HA frequency is back down to 7-8 per month. Has not tried propranolol yet. She is satisifed with current migraine freq and control with sumatriptan.  UPDATE 03/05/13: Since last visit, was doing well until few months ago, when migraines increased up to 14-15 days per month. She thinks she is going through menopause, and has recently tried estrogen patch to see if symptoms and HA improve. So far, it seems to be working. Otherwise, sumatriptan and NSAIDs still help with HA control, but number of days is the main problem (i.e. running out of sumatriptan before end of month).   PRIOR HPI (04/16/12): 49 year old right-handed female, with hypothyroidism, anemia, here for evaluation of migraine headaches. Patient reports history of migraine headaches since her teenage years. She describes global, severe throbbing headaches with mild nausea. No photophobia or phonophobia. No auras. No vision changes. Triggers include menstrual cycle and insomnia. [facility]. Currently she  is well-controlled on combination of topiramate 50 mg at night and sumatriptan 100 mg as needed. Presently she is having 5-6 headache days per month. This is a significant improvement compared to a few years ago. Patient is satisfied with her current headache control. Patient has been on higher doses of topiramate in the past. She's tried Toprol, amitriptyline, as well as a variety of NSAIDs and triptans. Family history notable for migraine in her son.   REVIEW OF SYSTEMS: Full 14 system review of systems performed and notable only as per HPI.   ALLERGIES: No Known Allergies  HOME MEDICATIONS: Outpatient Prescriptions Prior to Visit  Medication Sig Dispense Refill  . ACZONE 5 % topical gel Apply 1 application topically daily.    . celecoxib (CELEBREX) 100 MG capsule Take 100 mg by mouth as needed.     Marland Kitchen levothyroxine (SYNTHROID, LEVOTHROID) 50 MCG tablet Take 1 tablet by mouth at bedtime.    . tretinoin (RETIN-A) 0.1 % cream Apply 1 application topically daily.    Marland Kitchen amitriptyline (ELAVIL) 25 MG tablet Take 1 tablet (25 mg total) by mouth at bedtime. 90 tablet 3  . SUMAtriptan (IMITREX) 100 MG tablet Take 1 tablet (100 mg total) by mouth as needed for migraine or headache. May repeat in 2 hours if headache persists or recurs. Max 2 tabs per 24 hours. Max 8 tabs per month. 24 tablet 4  . minocycline (MINOCIN,DYNACIN) 100 MG capsule Take 1 capsule by mouth daily.     No facility-administered medications prior to visit.    PAST MEDICAL HISTORY: Past Medical History  Diagnosis Date  . Hypothyroid   . Chronic  anemia   . Melanoma 06/2014    back  . Migraine     07/2014 4 migraines    PAST SURGICAL HISTORY: Past Surgical History  Procedure Laterality Date  . Knee surgery Right     multiple x's  . Cesarean section  1988  . Hand surgery      x2  . Skin surgery  06/2014    melanoma on back removed in dr office    FAMILY HISTORY: Family History  Problem Relation Age of Onset  .  Migraines Son     SOCIAL HISTORY:  History   Social History  . Marital Status: Married    Spouse Name: Jonni Sanger  . Number of Children: 3  . Years of Education: Post Grad   Occupational History  .  Other    n/a   Social History Main Topics  . Smoking status: Never Smoker   . Smokeless tobacco: Never Used  . Alcohol Use: Yes     Comment: 1-2 alcohol drinks on the weekends  . Drug Use: Not on file  . Sexual Activity: Not on file   Other Topics Concern  . Not on file   Social History Narrative   Patient lives at home with her spouse.   Caffeine Use: 1 cup 2-3 times weekly     PHYSICAL EXAM  Filed Vitals:   08/18/14 1455  BP: 107/66  Pulse: 69  Height: _0  (1.6 m)  Weight: 139 lb 12.8 oz (63.413 kg)    Not recorded      Body mass index is 24.77 kg/(m^2).  GENERAL EXAM: Patient is in no distress; well developed, nourished and groomed; neck is supple  CARDIOVASCULAR: Regular rate and rhythm, no murmurs  NEUROLOGIC: MENTAL STATUS: awake, alert, language fluent, comprehension intact, naming intact, fund of knowledge appropriate CRANIAL NERVE:  pupils equal and reactive to light, visual fields full to confrontation, extraocular muscles intact, no nystagmus, facial sensation and strength symmetric, hearing intact, palate elevates symmetrically, uvula midline, shoulder shrug symmetric, tongue midline. MOTOR: normal bulk and tone, full strength in the BUE, BLE SENSORY: normal and symmetric to light touch COORDINATION: finger-nose-finger, fine finger movements normal REFLEXES: deferred GAIT/STATION: narrow based gait    DIAGNOSTIC DATA (LABS, IMAGING, TESTING) - I reviewed patient records, labs, notes, testing and imaging myself where available.  Lab Results  Component Value Date   WBC 9.6 12/10/2006   HGB 11.9* 12/10/2006   HCT 34.5* 12/10/2006   MCV 87.7 12/10/2006   PLT 226 12/10/2006      Component Value Date/Time   NA 140 12/10/2006 0422   K 4.0  12/10/2006 0422   CL 112 12/10/2006 0422   CO2 24 12/10/2006 0422   GLUCOSE 111* 12/10/2006 0422   BUN 8 12/10/2006 0422   CREATININE 0.79 12/10/2006 0422   CALCIUM 8.8 12/10/2006 0422   GFRNONAA >60 12/10/2006 0422   GFRAA  12/10/2006 0422    >60        The eGFR has been calculated using the MDRD equation. This calculation has not been validated in all clinical   No results found for: CHOL No results found for: HGBA1C No results found for: VITAMINB12 No results found for: TSH   ASSESSMENT AND PLAN  49 y.o. year old female here with migraines without aura since 1980's. Increasing frequency of migraines in Nov/Dec 2014 seemed to be related to onset of menopause, and seemed to be improved with estrogen therapy. Also tapered off TPX because of memory  complaints. Amitriptyline working well to help with insomnia and headache control.   PLAN:  I spent 10 minutes of face to face time with patient. Greater than 50% of time was spent in counseling and coordination of care with patient. In summary we discussed:  - continue amitriptyline 48m qhs - continue sumatriptan prn  Meds ordered this encounter  Medications  . amitriptyline (ELAVIL) 25 MG tablet    Sig: Take 1 tablet (25 mg total) by mouth at bedtime.    Dispense:  90 tablet    Refill:  3  . SUMAtriptan (IMITREX) 100 MG tablet    Sig: Take 1 tablet (100 mg total) by mouth as needed for migraine or headache (Max 2 tabs per 24 hours. Max 8 tabs per month.).    Dispense:  8 tablet    Refill:  6   Return in about 6 months (around 02/18/2015).    VPenni Bombard MD 79/20/1007 31:21PM Certified in Neurology, Neurophysiology and Neuroimaging  [facility] [address] [place] [phone]

## 2014-08-30 ENCOUNTER — Other Ambulatory Visit: Payer: Self-pay | Admitting: Gynecology

## 2014-08-31 LAB — CYTOLOGY - PAP

## 2015-02-21 ENCOUNTER — Ambulatory Visit (INDEPENDENT_AMBULATORY_CARE_PROVIDER_SITE_OTHER): Payer: Managed Care, Other (non HMO) | Admitting: Diagnostic Neuroimaging

## 2015-02-21 ENCOUNTER — Encounter: Payer: Self-pay | Admitting: Diagnostic Neuroimaging

## 2015-02-21 VITALS — BP 103/62 | HR 74 | Ht 63.0 in | Wt 140.8 lb

## 2015-02-21 DIAGNOSIS — G43009 Migraine without aura, not intractable, without status migrainosus: Secondary | ICD-10-CM | POA: Diagnosis not present

## 2015-02-21 MED ORDER — SUMATRIPTAN SUCCINATE 100 MG PO TABS: 100.0000 mg | ORAL_TABLET | ORAL | Status: DC | PRN

## 2015-02-21 MED ORDER — AMITRIPTYLINE HCL 25 MG PO TABS: 25.0000 mg | ORAL_TABLET | Freq: Every day | ORAL | Status: DC

## 2015-02-21 NOTE — Progress Notes (Signed)
[facility]  PATIENT: Wendy Whitaker DOB: 12-Jun-1965  REFERRING CLINICIAN:  HISTORY FROM: patient  REASON FOR VISIT: follow up   HISTORICAL  CHIEF COMPLAINT:  Chief Complaint  Patient presents with  . Migraine    rm 7, "having migraine daily x 2 mos, Imitrex almost daily w/good relief"  . Follow-up    6 month    HISTORY OF PRESENT ILLNESS:   UPDATE 02/21/15: Since last visit, HA stable. In last 6 months, had 2 x 3-5 day attacks of migraine. Last attack in the past week. Sumatriptan working. Amitriptyline working.   UPDATE 08/18/14: Since last visit, stable. No headaches over 3 months, then no explanation had 4 migraine in last 1 month. These were controlled with sumatriptan. Patient satisfied with headache control.  UPDATE 02/17/14: Since last visit, doing much better on amitriptyline. Sleep is better. HA are improved. Has had 1 or 2 months of headache freedom.  UPDATE 11/17/13: Since last visit, continues with 7-8 migraines per month. Sometimes runs out of sumatriptan before end of month. Reluctant to try propranolol due to side effects of exercise intolerance.   UPDATE 05/18/13: Since last visit, HA frequency is back down to 7-8 per month. Has not tried propranolol yet. She is satisifed with current migraine freq and control with sumatriptan.  UPDATE 03/05/13: Since last visit, was doing well until few months ago, when migraines increased up to 14-15 days per month. She thinks she is going through menopause, and has recently tried estrogen patch to see if symptoms and HA improve. So far, it seems to be working. Otherwise, sumatriptan and NSAIDs still help with HA control, but number of days is the main problem (i.e. running out of sumatriptan before end of month).   PRIOR HPI (04/16/12): 50 year old right-handed female, with hypothyroidism, anemia, here for evaluation of migraine headaches. Patient reports history of migraine headaches since her teenage years. She  describes global, severe throbbing headaches with mild nausea. No photophobia or phonophobia. No auras. No vision changes. Triggers include menstrual cycle and insomnia. [facility]. Currently she is well-controlled on combination of topiramate 50 mg at night and sumatriptan 100 mg as needed. Presently she is having 5-6 headache days per month. This is a significant improvement compared to a few years ago. Patient is satisfied with her current headache control. Patient has been on higher doses of topiramate in the past. She's tried Toprol, amitriptyline, as well as a variety of NSAIDs and triptans. Family history notable for migraine in her son.   REVIEW OF SYSTEMS: Full 14 system review of systems performed and notable only as per HPI.   ALLERGIES: No Known Allergies  HOME MEDICATIONS: Outpatient Prescriptions Prior to Visit  Medication Sig Dispense Refill  . ACZONE 5 % topical gel Apply 1 application topically daily.    Marland Kitchen amitriptyline (ELAVIL) 25 MG tablet Take 1 tablet (25 mg total) by mouth at bedtime. 90 tablet 3  . celecoxib (CELEBREX) 100 MG capsule Take 100 mg by mouth as needed.     Marland Kitchen levothyroxine (SYNTHROID, LEVOTHROID) 50 MCG tablet Take 1 tablet by mouth at bedtime.    . SUMAtriptan (IMITREX) 100 MG tablet Take 1 tablet (100 mg total) by mouth as needed for migraine or headache (Max 2 tabs per 24 hours. Max 8 tabs per month.). 8 tablet 6  . minocycline (MINOCIN,DYNACIN) 100 MG capsule Take 1 capsule by mouth daily.    Marland Kitchen tretinoin (RETIN-A)  0.1 % cream Apply 1 application topically daily.     No facility-administered medications prior to visit.    PAST MEDICAL HISTORY: Past Medical History  Diagnosis Date  . Hypothyroid   . Chronic anemia   . Melanoma ([place]) 06/2014    back  . Migraine     07/2014 4 migraines  . IUD (intrauterine device) in place 07/2013    placed to control menses and related HA    PAST  SURGICAL HISTORY: Past Surgical History  Procedure Laterality Date  . Knee surgery Right     multiple x's  . Cesarean section  1988  . Hand surgery      x2  . Skin surgery  06/2014    melanoma on back removed in dr office    FAMILY HISTORY: Family History  Problem Relation Age of Onset  . Migraines Son     SOCIAL HISTORY:  Social History   Social History  . Marital Status: Married    Spouse Name: Jonni Sanger  . Number of Children: 3  . Years of Education: Post Grad   Occupational History  .  Other    n/a   Social History Main Topics  . Smoking status: Never Smoker   . Smokeless tobacco: Never Used  . Alcohol Use: Yes     Comment: 1-2 alcohol drinks on the weekends  . Drug Use: Not on file  . Sexual Activity: Not on file   Other Topics Concern  . Not on file   Social History Narrative   Patient lives at home with her spouse.   Caffeine Use: 1 cup 2-3 times weekly     PHYSICAL EXAM  Filed Vitals:   02/21/15 1055  BP: 103/62  Pulse: 74  Height: '5\' 3"'$  (1.6 m)  Weight: 140 lb 12.8 oz (63.866 kg)    Not recorded      Body mass index is 24.95 kg/(m^2).  GENERAL EXAM: Patient is in no distress; well developed, nourished and groomed; neck is supple  CARDIOVASCULAR: Regular rate and rhythm, no murmurs  NEUROLOGIC: MENTAL STATUS: awake, alert, language fluent, comprehension intact, naming intact, fund of knowledge appropriate CRANIAL NERVE:  pupils equal and reactive to light, visual fields full to confrontation, extraocular muscles intact, no nystagmus, facial sensation and strength symmetric, hearing intact, palate elevates symmetrically, uvula midline, shoulder shrug symmetric, tongue midline. MOTOR: normal bulk and tone, full strength in the BUE, BLE SENSORY: normal and symmetric to light touch COORDINATION: finger-nose-finger, fine finger movements normal REFLEXES: deferred GAIT/STATION: narrow based gait    DIAGNOSTIC DATA (LABS, IMAGING, TESTING) -  I reviewed patient records, labs, notes, testing and imaging myself where available.  Lab Results  Component Value Date   WBC 9.6 12/10/2006   HGB 11.9* 12/10/2006   HCT 34.5* 12/10/2006   MCV 87.7 12/10/2006   PLT 226 12/10/2006      Component Value Date/Time   NA 140 12/10/2006 0422   K 4.0 12/10/2006 0422   CL 112 12/10/2006 0422   CO2 24 12/10/2006 0422   GLUCOSE 111* 12/10/2006 0422   BUN 8 12/10/2006 0422   CREATININE 0.79 12/10/2006 0422   CALCIUM 8.8 12/10/2006 0422   GFRNONAA >60 12/10/2006 0422   GFRAA  12/10/2006 0422    >60        The eGFR has been calculated using the MDRD equation. This calculation has not been validated in all clinical   No results found for: CHOL No results found for: HGBA1C No  results found for: VITAMINB12 No results found for: TSH   ASSESSMENT AND PLAN  50 y.o. year old female here with migraines without aura since 1980's. Increasing frequency of migraines in Nov/Dec 2014 seemed to be related to onset of menopause, and seemed to be improved with estrogen therapy. Also tapered off TPX because of memory complaints. Amitriptyline working well to help with insomnia and headache control.   PLAN:  - continue amitriptyline '25mg'$  qhs - continue sumatriptan prn  Meds ordered this encounter  Medications  . DISCONTD: SUMAtriptan (IMITREX) 100 MG tablet    Sig: Take 1 tablet (100 mg total) by mouth as needed for migraine or headache (Max 2 tabs per 24 hours. Max 8 tabs per month.).    Dispense:  8 tablet    Refill:  6  . amitriptyline (ELAVIL) 25 MG tablet    Sig: Take 1 tablet (25 mg total) by mouth at bedtime.    Dispense:  90 tablet    Refill:  3  . SUMAtriptan (IMITREX) 100 MG tablet    Sig: Take 1 tablet (100 mg total) by mouth as needed for migraine or headache (Max 2 tabs per 24 hours. Max 8 tabs per month.).    Dispense:  8 tablet    Refill:  12   Return in about 1 year (around 02/21/2016).    Penni Bombard, MD 09/24/6013,  61:53 AM Certified in Neurology, Neurophysiology and Neuroimaging  [facility] [address], [place], [place] [address] [phone]

## 2016-01-17 ENCOUNTER — Telehealth: Payer: Self-pay | Admitting: *Deleted

## 2016-01-17 NOTE — Telephone Encounter (Signed)
error 

## 2016-02-20 ENCOUNTER — Ambulatory Visit: Payer: Managed Care, Other (non HMO) | Admitting: Diagnostic Neuroimaging

## 2016-02-22 ENCOUNTER — Other Ambulatory Visit: Payer: Self-pay | Admitting: Diagnostic Neuroimaging

## 2016-03-07 ENCOUNTER — Other Ambulatory Visit: Payer: Self-pay | Admitting: Diagnostic Neuroimaging

## 2016-03-28 ENCOUNTER — Encounter: Payer: Managed Care, Other (non HMO) | Admitting: Diagnostic Neuroimaging

## 2016-03-28 ENCOUNTER — Encounter: Payer: Self-pay | Admitting: *Deleted

## 2016-03-28 ENCOUNTER — Encounter: Payer: Self-pay | Admitting: Diagnostic Neuroimaging

## 2016-03-30 NOTE — Progress Notes (Signed)
This encounter was created in error - please disregard.

## 2016-04-03 ENCOUNTER — Ambulatory Visit: Payer: Self-pay | Admitting: Diagnostic Neuroimaging

## 2016-04-03 ENCOUNTER — Telehealth: Payer: Self-pay | Admitting: Diagnostic Neuroimaging

## 2016-04-03 NOTE — Telephone Encounter (Signed)
LVM on home phone, called mobile # and spoke with patient offering appointment tomorrow. Patient is leaving town very early tomorrow morning and will not return until late the night of March 7th. This RN advised will ask Dr Leta Baptist to call her on her mobile phone. She stated she will not be able to be reached today between 3 pm - 8 pm. She stated he could reach her tomorrow afternoon on her cell phone if he is unable to reach her today. She verbalized understanding, appreciation of call.

## 2016-04-03 NOTE — Telephone Encounter (Signed)
I called patient. No answer. Will try again tomorrow. -VRP 

## 2016-04-03 NOTE — Telephone Encounter (Signed)
Pt called to r/s appt, provider out of the office.  Appt was scheduled for 3/13, pt said RN advised she had earlier appts. Pls call to schedule

## 2016-04-04 MED ORDER — AMITRIPTYLINE HCL 25 MG PO TABS: 25.0000 mg | ORAL_TABLET | Freq: Every day | ORAL | 4 refills | Status: DC

## 2016-04-04 MED ORDER — SUMATRIPTAN SUCCINATE 100 MG PO TABS: ORAL_TABLET | ORAL | 11 refills | Status: DC

## 2016-04-04 NOTE — Addendum Note (Signed)
Addended byAndrey Spearman on: 04/04/2016 08:13 PM   Modules accepted: Orders

## 2016-04-04 NOTE — Telephone Encounter (Signed)
I called patient. Sumatriptan not helping as much anymore. HA are more often and longer lasting. No other triggering factors except possibly menopause effect and monthly cycle effect.    PLAN: - increase amitriptyline to 50mg  at bedtime - continue sumatriptan for migraine rescue. - may consider changing triptan to rizatriptan or eletriptan  Meds ordered this encounter  Medications  . amitriptyline (ELAVIL) 25 MG tablet    Sig: Take 1-2 tablets (25-50 mg total) by mouth at bedtime.    Dispense:  180 tablet    Refill:  4    Pending appt 03/28/16  . SUMAtriptan (IMITREX) 100 MG tablet    Sig: TAKE 1 TABLET BY MOUTH AS NEEDED MIGRAINE, MAX 2 PER 24HRS, 8 PER MONTH    Dispense:  8 tablet    Refill:  11    Penni Bombard, MD [id number], [id number] PM Certified in Neurology, Neurophysiology and Neuroimaging  [facility] [address], [place],  [address] [phone]

## 2016-04-13 ENCOUNTER — Ambulatory Visit: Payer: Self-pay | Admitting: Diagnostic Neuroimaging

## 2016-04-17 ENCOUNTER — Ambulatory Visit: Payer: Self-pay | Admitting: Diagnostic Neuroimaging

## 2016-08-06 ENCOUNTER — Ambulatory Visit (INDEPENDENT_AMBULATORY_CARE_PROVIDER_SITE_OTHER): Payer: 59 | Admitting: Diagnostic Neuroimaging

## 2016-08-06 ENCOUNTER — Encounter: Payer: Self-pay | Admitting: Diagnostic Neuroimaging

## 2016-08-06 VITALS — BP 116/83 | HR 77 | Ht 63.0 in | Wt 139.6 lb

## 2016-08-06 DIAGNOSIS — G43009 Migraine without aura, not intractable, without status migrainosus: Secondary | ICD-10-CM | POA: Diagnosis not present

## 2016-08-06 MED ORDER — SUMATRIPTAN SUCCINATE 100 MG PO TABS: ORAL_TABLET | ORAL | 11 refills | Status: DC

## 2016-08-06 MED ORDER — AMITRIPTYLINE HCL 25 MG PO TABS: 25.0000 mg | ORAL_TABLET | Freq: Every day | ORAL | 4 refills | Status: DC

## 2016-08-06 NOTE — Progress Notes (Signed)
[facility]  PATIENT: Wendy Whitaker DOB: 04/19/1965  REFERRING CLINICIAN:  HISTORY FROM: patient  REASON FOR VISIT: follow up   HISTORICAL  CHIEF COMPLAINT:  Chief Complaint  Patient presents with  . Follow-up  . Migraine    better,  3-5 / month.      HISTORY OF PRESENT ILLNESS:   UPDATE 08/06/16: Having 3-5 days migraine per month. Tolerating amitriptyline 37.'5mg'$  at bedtime. Sumatriptan working for now. Hormones may play a role in her headaches.   UPDATE 02/21/15: Since last visit, HA stable. In last 6 months, had 2 x 3-5 day attacks of migraine. Last attack in the past week. Sumatriptan working. Amitriptyline working.   UPDATE 08/18/14: Since last visit, stable. No headaches over 3 months, then no explanation had 4 migraine in last 1 month. These were controlled with sumatriptan. Patient satisfied with headache control.  UPDATE 02/17/14: Since last visit, doing much better on amitriptyline. Sleep is better. HA are improved. Has had 1 or 2 months of headache freedom.  UPDATE 11/17/13: Since last visit, continues with 7-8 migraines per month. Sometimes runs out of sumatriptan before end of month. Reluctant to try propranolol due to side effects of exercise intolerance.   UPDATE 05/18/13: Since last visit, HA frequency is back down to 7-8 per month. Has not tried propranolol yet. She is satisifed with current migraine freq and control with sumatriptan.  UPDATE 03/05/13: Since last visit, was doing well until few months ago, when migraines increased up to 14-15 days per month. She thinks she is going through menopause, and has recently tried estrogen patch to see if symptoms and HA improve. So far, it seems to be working. Otherwise, sumatriptan and NSAIDs still help with HA control, but number of days is the main problem (i.e. running out of sumatriptan before end of month).   PRIOR HPI (04/16/12): 51 year old right-handed female, with hypothyroidism, anemia, here for  evaluation of migraine headaches. Patient reports history of migraine headaches since her teenage years. She describes global, severe throbbing headaches with mild nausea. No photophobia or phonophobia. No auras. No vision changes. Triggers include menstrual cycle and insomnia. [facility]. Currently she is well-controlled on combination of topiramate 50 mg at night and sumatriptan 100 mg as needed. Presently she is having 5-6 headache days per month. This is a significant improvement compared to a few years ago. Patient is satisfied with her current headache control. Patient has been on higher doses of topiramate in the past. She's tried Toprol, amitriptyline, as well as a variety of NSAIDs and triptans. Family history notable for migraine in her son.   REVIEW OF SYSTEMS: Full 14 system review of systems performed and notable only as per HPI. Headaches.   ALLERGIES: No Known Allergies  HOME MEDICATIONS: Outpatient Medications Prior to Visit  Medication Sig Dispense Refill  . amitriptyline (ELAVIL) 25 MG tablet Take 1-2 tablets (25-50 mg total) by mouth at bedtime. 180 tablet 4  . levothyroxine (SYNTHROID, LEVOTHROID) 50 MCG tablet Take by mouth at bedtime. Alternating days of '50mg'$  and '75mg'$  daily.    Marland Kitchen spironolactone (ALDACTONE) 50 MG tablet Take 25 mg by mouth daily.  0  . SUMAtriptan (IMITREX) 100 MG tablet TAKE 1 TABLET BY MOUTH AS NEEDED MIGRAINE, MAX 2 PER 24HRS, 8 PER MONTH 8 tablet 11  . celecoxib (CELEBREX) 100 MG capsule Take 100 mg by mouth as needed.      No facility-administered medications prior  to visit.     PAST MEDICAL HISTORY: Past Medical History:  Diagnosis Date  . Chronic anemia   . Hypothyroid   . IUD (intrauterine device) in place 07/2013   placed to control menses and related HA  . Melanoma ([place]) 06/2014   back  . Migraine    07/2014 4 migraines    PAST SURGICAL HISTORY: Past Surgical History:    Procedure Laterality Date  . CESAREAN SECTION  1988  . HAND SURGERY     x2  . KNEE SURGERY Right    multiple x's  . SKIN SURGERY  06/2014   melanoma on back removed in dr office  . steroid inj in L elbow      FAMILY HISTORY: Family History  Problem Relation Age of Onset  . Migraines Son     SOCIAL HISTORY:  Social History   Social History  . Marital status: Married    Spouse name: Jonni Sanger  . Number of children: 3  . Years of education: Post Grad   Occupational History  .  Other    n/a   Social History Main Topics  . Smoking status: Never Smoker  . Smokeless tobacco: Never Used  . Alcohol use Yes     Comment: 1-2 alcohol drinks on the weekends  . Drug use: Unknown  . Sexual activity: Not on file   Other Topics Concern  . Not on file   Social History Narrative   Patient lives at home with her spouse.   Caffeine Use: 1 cup 2-3 times weekly     PHYSICAL EXAM  Vitals:   08/06/16 1213  BP: 116/83  Pulse: 77  Weight: 139 lb 9.6 oz (63.3 kg)  Height: '5\' 3"'$  (1.6 m)    Not recorded      Body mass index is 24.73 kg/m.  GENERAL EXAM: Patient is in no distress; well developed, nourished and groomed; neck is supple  CARDIOVASCULAR: Regular rate and rhythm, no murmurs  NEUROLOGIC: MENTAL STATUS: awake, alert, language fluent, comprehension intact, naming intact, fund of knowledge appropriate CRANIAL NERVE:  pupils equal and reactive to light, visual fields full to confrontation, extraocular muscles intact, no nystagmus, facial sensation and strength symmetric, hearing intact, palate elevates symmetrically, uvula midline, shoulder shrug symmetric, tongue midline. MOTOR: normal bulk and tone, full strength in the BUE, BLE SENSORY: normal and symmetric to light touch COORDINATION: finger-nose-finger, fine finger movements normal REFLEXES: deferred GAIT/STATION: narrow based gait    DIAGNOSTIC DATA (LABS, IMAGING, TESTING) - I reviewed patient records,  labs, notes, testing and imaging myself where available.  Lab Results  Component Value Date   WBC 9.6 12/10/2006   HGB 11.9 (L) 12/10/2006   HCT 34.5 (L) 12/10/2006   MCV 87.7 12/10/2006   PLT 226 12/10/2006      Component Value Date/Time   NA 140 12/10/2006 0422   K 4.0 12/10/2006 0422   CL 112 12/10/2006 0422   CO2 24 12/10/2006 0422   GLUCOSE 111 (H) 12/10/2006 0422   BUN 8 12/10/2006 0422   CREATININE 0.79 12/10/2006 0422   CALCIUM 8.8 12/10/2006 0422   GFRNONAA >60 12/10/2006 0422   GFRAA  12/10/2006 0422    >60        The eGFR has been calculated using the MDRD equation. This calculation has not been validated in all clinical   No results found for: CHOL No results found for: HGBA1C No results found for: VITAMINB12 No results found for: TSH   ASSESSMENT  AND PLAN  51 y.o. year old female here with migraines without aura since 1980's. Increasing frequency of migraines in Nov/Dec 2014 seemed to be related to onset of menopause, and seemed to be improved with estrogen therapy. Also tapered off TPX because of memory complaints. Amitriptyline working well to help with insomnia and headache control.   Meds tried and failed: topiramate (memory loss)  Dx: Migraine without aura and without status migrainosus, not intractable    PLAN:  - continue amitriptyline 25-'50mg'$  at bedtime - continue sumatriptan '100mg'$  as needed; may consider rizatriptan in future  Meds ordered this encounter  Medications  . SUMAtriptan (IMITREX) 100 MG tablet    Sig: TAKE 1 TABLET BY MOUTH AS NEEDED MIGRAINE, MAX 2 PER 24HRS, 8 PER MONTH    Dispense:  8 tablet    Refill:  11  . amitriptyline (ELAVIL) 25 MG tablet    Sig: Take 1-2 tablets (25-50 mg total) by mouth at bedtime.    Dispense:  180 tablet    Refill:  4    Pending appt 03/28/16   Return in about 6 months (around 02/06/2017) for with Ward Givens.    Penni Bombard, MD 08/14/8919, 19:41 PM Certified in Neurology,  Neurophysiology and Neuroimaging  [facility] [address] [place],  [address] [phone]

## 2017-02-12 ENCOUNTER — Ambulatory Visit (INDEPENDENT_AMBULATORY_CARE_PROVIDER_SITE_OTHER): Payer: BLUE CROSS/BLUE SHIELD | Admitting: Adult Health

## 2017-02-12 ENCOUNTER — Encounter: Payer: Self-pay | Admitting: Adult Health

## 2017-02-12 VITALS — BP 119/75 | HR 70 | Wt 139.4 lb

## 2017-02-12 DIAGNOSIS — G43009 Migraine without aura, not intractable, without status migrainosus: Secondary | ICD-10-CM

## 2017-02-12 MED ORDER — SUMATRIPTAN SUCCINATE 100 MG PO TABS: ORAL_TABLET | ORAL | 11 refills | Status: DC

## 2017-02-12 MED ORDER — AMITRIPTYLINE HCL 25 MG PO TABS: 25.0000 mg | ORAL_TABLET | Freq: Every day | ORAL | 3 refills | Status: DC

## 2017-02-12 NOTE — Progress Notes (Signed)
PATIENT: Wendy Whitaker DOB: [birth year]  REASON FOR VISIT: follow up HISTORY FROM: patient  HISTORY OF PRESENT ILLNESS: Today 02/12/17 Wendy Whitaker is a 52 year old female with a history of migraine headaches.  She returns today for follow-up.  She states that she has approximately 1 week as a month that she will have a headache for 3-4 consecutive days.  She states that she no longer has a menstrual cycle as she has an IUD placed but she feels that the headaches come the same week every month.  Therefore she feels that there may be a hormonal component to her migraines.  She states that the headache is typically located across the forehead.  She sometimes will have mild photophobia.  She denies nausea vomiting.  She states that if she takes Imitrex her headache will resolve but typically does return the next day.  She states this she wakes to take Imitrex which on occasion she does he will turn into a debilitating migraine.  She continues to take amitriptyline 1-1/2 tablets at bedtime.  She returns today for an evaluation.  HISTORY UPDATE 08/06/16: Having 3-5 days migraine per month. Tolerating amitriptyline 37.'5mg'$  at bedtime. Sumatriptan working for now. Hormones may play a role in her headaches.   UPDATE 02/21/15: Since last visit, HA stable. In last 6 months, had 2 x 3-5 day attacks of migraine. Last attack in the past week. Sumatriptan working. Amitriptyline working.   UPDATE 08/18/14: Since last visit, stable. No headaches over 3 months, then no explanation had 4 migraine in last 1 month. These were controlled with sumatriptan. Patient satisfied with headache control.  UPDATE 02/17/14: Since last visit, doing much better on amitriptyline. Sleep is better. HA are improved. Has had 1 or 2 months of headache freedom.  UPDATE 11/17/13: Since last visit, continues with 7-8 migraines per month. Sometimes runs out of sumatriptan before end of month. Reluctant to try propranolol due to side effects  of exercise intolerance.   UPDATE 05/18/13: Since last visit, HA frequency is back down to 7-8 per month. Has not tried propranolol yet. She is satisifed with current migraine freq and control with sumatriptan.  UPDATE 03/05/13: Since last visit, was doing well until few months ago, when migraines increased up to 14-15 days per month. She thinks she is going through menopause, and has recently tried estrogen patch to see if symptoms and HA improve. So far, it seems to be working. Otherwise, sumatriptan and NSAIDs still help with HA control, but number of days is the main problem (i.e. running out of sumatriptan before end of month).   PRIOR HPI (04/16/12): 52 year old right-handed female, with hypothyroidism, anemia, here for evaluation of migraine headaches. Patient reports history of migraine headaches since her teenage years. She describes global, severe throbbing headaches with mild nausea. No photophobia or phonophobia. No auras. No vision changes. Triggers include menstrual cycle and insomnia. [facility]. Currently she is well-controlled on combination of topiramate 50 mg at night and sumatriptan 100 mg as needed. Presently she is having 5-6 headache days per month. This is a significant improvement compared to a few years ago. Patient is satisfied with her current headache control. Patient has been on higher doses of topiramate in the past. She's tried Toprol, amitriptyline, as well as a variety of NSAIDs and triptans. Family history notable for migraine in her son.  REVIEW OF SYSTEMS: Out of a complete 14 system review of symptoms, the patient  complains only of the following symptoms, and all other reviewed systems are negative.  See HPI  ALLERGIES: No Known Allergies  HOME MEDICATIONS: Outpatient Medications Prior to Visit  Medication Sig Dispense Refill  . amitriptyline (ELAVIL) 25 MG tablet Take 1-2 tablets (25-50 mg total)  by mouth at bedtime. 180 tablet 4  . levothyroxine (SYNTHROID, LEVOTHROID) 50 MCG tablet Take by mouth at bedtime. Alternating days of '50mg'$  and '75mg'$  daily.    Marland Kitchen spironolactone (ALDACTONE) 50 MG tablet Take 25 mg by mouth daily.  0  . SUMAtriptan (IMITREX) 100 MG tablet TAKE 1 TABLET BY MOUTH AS NEEDED MIGRAINE, MAX 2 PER 24HRS, 8 PER MONTH 8 tablet 11  . meloxicam (MOBIC) 7.5 MG tablet Take 7.5 mg by mouth 3 (three) times daily as needed for pain.     No facility-administered medications prior to visit.     PAST MEDICAL HISTORY: Past Medical History:  Diagnosis Date  . Chronic anemia   . Hypothyroid   . IUD (intrauterine device) in place 07/2013   placed to control menses and related HA  . Melanoma ([place]) 06/2014   back  . Migraine    07/2014 4 migraines    PAST SURGICAL HISTORY: Past Surgical History:  Procedure Laterality Date  . CESAREAN SECTION  1988  . HAND SURGERY     x2  . KNEE SURGERY Right    multiple x's  . SKIN SURGERY  06/2014   melanoma on back removed in dr office  . steroid inj in L elbow      FAMILY HISTORY: Family History  Problem Relation Age of Onset  . Migraines Son     SOCIAL HISTORY: Social History   Socioeconomic History  . Marital status: Married    Spouse name: Jonni Sanger  . Number of children: 3  . Years of education: Post Grad  . Highest education level: Not on file  Social Needs  . Financial resource strain: Not on file  . Food insecurity - worry: Not on file  . Food insecurity - inability: Not on file  . Transportation needs - medical: Not on file  . Transportation needs - non-medical: Not on file  Occupational History    Employer: OTHER    Comment: n/a  Tobacco Use  . Smoking status: Never Smoker  . Smokeless tobacco: Never Used  Substance and Sexual Activity  . Alcohol use: Yes    Comment: 1-2 alcohol drinks on the weekends  . Drug use: Not on file  . Sexual activity: Not on file  Other Topics Concern  . Not on file  Social  History Narrative   Patient lives at home with her spouse.   Caffeine Use: 1 cup 2-3 times weekly      PHYSICAL EXAM  Vitals:   02/12/17 1113  BP: 119/75  Pulse: 70  Weight: 139 lb 6.4 oz (63.2 kg)   Body mass index is 24.69 kg/m.  Generalized: Well developed, in no acute distress   Neurological examination  Mentation: Alert oriented to time, place, history taking. Follows all commands speech and language fluent Cranial nerve II-XII: Pupils were equal round reactive to light. Extraocular movements were full, visual field were full on confrontational test. Facial sensation and strength were normal. Uvula tongue midline. Head turning and shoulder shrug  were normal and symmetric. Motor: The motor testing reveals 5 over 5 strength of all 4 extremities. Good symmetric motor tone is noted throughout.  Sensory: Sensory testing is intact to soft touch on  all 4 extremities. No evidence of extinction is noted.  Coordination: Cerebellar testing reveals good finger-nose-finger and heel-to-shin bilaterally.  Gait and station: Gait is normal. Tandem gait is normal. Romberg is negative. No drift is seen.  Reflexes: Deep tendon reflexes are symmetric and normal bilaterally.   DIAGNOSTIC DATA (LABS, IMAGING, TESTING) - I reviewed patient records, labs, notes, testing and imaging myself where available.  Lab Results  Component Value Date   WBC 9.6 12/10/2006   HGB 11.9 (L) 12/10/2006   HCT 34.5 (L) 12/10/2006   MCV 87.7 12/10/2006   PLT 226 12/10/2006      Component Value Date/Time   NA 140 12/10/2006 0422   K 4.0 12/10/2006 0422   CL 112 12/10/2006 0422   CO2 24 12/10/2006 0422   GLUCOSE 111 (H) 12/10/2006 0422   BUN 8 12/10/2006 0422   CREATININE 0.79 12/10/2006 0422   CALCIUM 8.8 12/10/2006 0422   GFRNONAA >60 12/10/2006 0422   GFRAA  12/10/2006 0422    >60        The eGFR has been calculated using the MDRD equation. This calculation has not been validated in all clinical    No results found for: CHOL, HDL, LDLCALC, LDLDIRECT, TRIG, CHOLHDL No results found for: HGBA1C No results found for: VITAMINB12 No results found for: TSH    ASSESSMENT AND PLAN 52 y.o. year old female  has a past medical history of Chronic anemia, Hypothyroid, IUD (intrauterine device) in place (07/2013), Melanoma ([place]) (06/2014), and Migraine. here with:  1.  Migraine headache  The patient will increase amitriptyline to 2 tablets at bedtime.  I advised that she should take Imitrex at the onset of a migraine.  Advised that she can try taking naproxen 200 mg - 500 mg with Imitrex.  We did discuss potentially trying frovatriptan in the future.  I advised that if her headache frequency did not improve she should let us know.  She will follow-up in 6 months or sooner if needed.     Ward Givens, MSN, NP-C 02/12/2017, 11:37 AM [facility] [address], [place], [place] [address] [phone]

## 2017-02-12 NOTE — Patient Instructions (Signed)
Your Plan:  Continue Imitrex- take at the onset of migraine- try not to postpone Take Naproxen 200-500 mg with Imitrex Increase amitriptyline to 2 tabs (50 mg) at bedtime If your symptoms worsen or you develop new symptoms please let us know.   Thank you for coming to see [place] at [facility]. I hope we have been able to provide you high quality care today.  You may receive a patient satisfaction survey over the next few weeks. We would appreciate your feedback and comments so that we may continue to improve ourselves and the health of our patients.

## 2017-02-14 NOTE — Progress Notes (Signed)
I reviewed note and agree with plan.   Penni Bombard, MD 09/14/1749, 0:25 PM Certified in Neurology, Neurophysiology and Neuroimaging  [facility] [address], [place], [place] [address] [phone]

## 2017-03-07 DIAGNOSIS — D229 Melanocytic nevi, unspecified: Secondary | ICD-10-CM | POA: Diagnosis not present

## 2017-03-07 DIAGNOSIS — L814 Other melanin hyperpigmentation: Secondary | ICD-10-CM | POA: Diagnosis not present

## 2017-03-07 DIAGNOSIS — Z85820 Personal history of malignant melanoma of skin: Secondary | ICD-10-CM | POA: Diagnosis not present

## 2017-03-07 DIAGNOSIS — L821 Other seborrheic keratosis: Secondary | ICD-10-CM | POA: Diagnosis not present

## 2017-04-15 DIAGNOSIS — M79672 Pain in left foot: Secondary | ICD-10-CM | POA: Diagnosis not present

## 2017-04-15 DIAGNOSIS — M722 Plantar fascial fibromatosis: Secondary | ICD-10-CM | POA: Diagnosis not present

## 2017-04-16 DIAGNOSIS — M722 Plantar fascial fibromatosis: Secondary | ICD-10-CM | POA: Diagnosis not present

## 2017-04-19 DIAGNOSIS — M722 Plantar fascial fibromatosis: Secondary | ICD-10-CM | POA: Diagnosis not present

## 2017-04-26 DIAGNOSIS — M722 Plantar fascial fibromatosis: Secondary | ICD-10-CM | POA: Diagnosis not present

## 2017-05-01 DIAGNOSIS — M722 Plantar fascial fibromatosis: Secondary | ICD-10-CM | POA: Diagnosis not present

## 2017-07-03 DIAGNOSIS — Z23 Encounter for immunization: Secondary | ICD-10-CM | POA: Diagnosis not present

## 2017-07-05 DIAGNOSIS — M7541 Impingement syndrome of right shoulder: Secondary | ICD-10-CM | POA: Diagnosis not present

## 2017-07-05 DIAGNOSIS — S43431D Superior glenoid labrum lesion of right shoulder, subsequent encounter: Secondary | ICD-10-CM | POA: Diagnosis not present

## 2017-08-08 ENCOUNTER — Other Ambulatory Visit: Payer: Self-pay | Admitting: Diagnostic Neuroimaging

## 2017-08-13 ENCOUNTER — Other Ambulatory Visit: Payer: Self-pay

## 2017-08-13 ENCOUNTER — Encounter: Payer: Self-pay | Admitting: Adult Health

## 2017-08-13 ENCOUNTER — Ambulatory Visit: Payer: BLUE CROSS/BLUE SHIELD | Admitting: Adult Health

## 2017-08-13 VITALS — BP 110/77 | HR 72 | Ht 63.0 in | Wt 141.5 lb

## 2017-08-13 DIAGNOSIS — G43009 Migraine without aura, not intractable, without status migrainosus: Secondary | ICD-10-CM | POA: Diagnosis not present

## 2017-08-13 MED ORDER — ERENUMAB-AOOE 70 MG/ML ~~LOC~~ SOAJ: 70.0000 mg | SUBCUTANEOUS | 5 refills | Status: DC

## 2017-08-13 NOTE — Progress Notes (Signed)
PATIENT: Wendy Whitaker DOB: 07/14/1965  REASON FOR VISIT: follow up HISTORY FROM: patient  HISTORY OF PRESENT ILLNESS: Today 08/13/17: Wendy Whitaker is a 52 year old female with a history of migraine headaches.  She returns today for follow-up.  She states that her headache frequency has worsened.  She has approximately 4-6 headaches a month.  She states that she is running out of sumatriptan.  She typically always has to take 2 tablets when she gets a headache.  Her headache continues to occur across the forehead and sometimes in the neck.  She denies photophobia and phonophobia.  She does report nausea.  In the past she has tried NSAIDs, Cambia, Topamax and toprol.  She states that she is also going through menopause and unsure if this is the cause of increased headaches.  She returns today for evaluation.  UPDATE (MM)  02/12/17 Wendy Whitaker is a 52 year old female with a history of migraine headaches.  She returns today for follow-up.  She states that she has approximately 1 week as a month that she will have a headache for 3-4 consecutive days.  She states that she no longer has a menstrual cycle as she has an IUD placed but she feels that the headaches come the same week every month.  Therefore she feels that there may be a hormonal component to her migraines.  She states that the headache is typically located across the forehead.  She sometimes will have mild photophobia.  She denies nausea vomiting.  She states that if she takes Imitrex her headache will resolve but typically does return the next day.  She states this she wakes to take Imitrex which on occasion she does he will turn into a debilitating migraine.  She continues to take amitriptyline 1-1/2 tablets at bedtime.  She returns today for an evaluation.   UPDATE 08/06/16: Having 3-5 days migraine per month.Tolerating amitriptyline 37.41m at bedtime. Sumatriptan working for now. Hormones may play a role in her headaches.  UPDATE  02/21/15: Since last visit, HA stable. In last 6 months, had 2 x 3-5 day attacks of migraine. Last attack in the past week. Sumatriptan working. Amitriptyline working.   UPDATE 08/18/14: Since last visit, stable. No headaches over 3 months, then no explanation had 4 migraine in last 1 month. These were controlled with sumatriptan. Patient satisfied with headache control.  UPDATE 02/17/14: Since last visit, doing much better on amitriptyline. Sleep is better. HA are improved. Has had 1 or 2 months of headache freedom.  UPDATE 11/17/13: Since last visit, continues with 7-8 migraines per month. Sometimes runs out of sumatriptan before end of month. Reluctant to try propranolol due to side effects of exercise intolerance.   UPDATE 05/18/13: Since last visit, HA frequency is back down to 7-8 per month. Has not tried propranolol yet. She is satisifed with current migraine freq and control with sumatriptan.  UPDATE 03/05/13: Since last visit, was doing well until few months ago, when migraines increased up to 14-15 days per month. She thinks she is going through menopause, and has recently tried estrogen patch to see if symptoms and HA improve. So far, it seems to be working. Otherwise, sumatriptan and NSAIDs still help with HA control, but number of days is the main problem (i.e. running out of sumatriptan before end of month).   PRIOR HPI (04/16/12): 52year old right-handed female, with hypothyroidism, anemia, here for evaluation of migraine headaches. Patient reports history of migraine headaches since her teenage years. She describes global, severe  throbbing headaches with mild nausea. No photophobia or phonophobia. No auras. No vision changes. Triggers include menstrual cycle and insomnia. [facility]. Currently she is well-controlled on combination of topiramate 50 mg at night and sumatriptan 100 mg as needed. Presently she is having 5-6  headache days per month. This is a significant improvement compared to a few years ago. Patient is satisfied with her current headache control. Patient has been on higher doses of topiramate in the past. She's tried Toprol, amitriptyline, as well as a variety of NSAIDs and triptans. Family history notable for migraine in her son.      REVIEW OF SYSTEMS: Out of a complete 14 system review of symptoms, the patient complains only of the following symptoms, and all other reviewed systems are negative.  Headache, excessive sweating  ALLERGIES: No Known Allergies  HOME MEDICATIONS: Outpatient Medications Prior to Visit  Medication Sig Dispense Refill  . amitriptyline (ELAVIL) 25 MG tablet Take 1-2 tablets (25-50 mg total) by mouth at bedtime. 180 tablet 3  . levothyroxine (SYNTHROID, LEVOTHROID) 50 MCG tablet Take by mouth at bedtime. Alternating days of 1m and 752mdaily.    . Marland Kitchenpironolactone (ALDACTONE) 50 MG tablet Take 25 mg by mouth daily.  0  . SUMAtriptan (IMITREX) 100 MG tablet Take 1 tab at the onset of migraine can repeat in 2 hours if needed. 10 tablet 11   No facility-administered medications prior to visit.     PAST MEDICAL HISTORY: Past Medical History:  Diagnosis Date  . Chronic anemia   . Hypothyroid   . IUD (intrauterine device) in place 07/2013   placed to control menses and related HA  . [place]an[place]   back  . Migraine    07/2014 4 migraines    PAST SURGICAL HISTORY: Past Surgical History:  Procedure Laterality Date  . CESAREAN SECTION  1988  . HAND SURGERY     x2  . KNEE SURGERY Right    multiple x's  . SKIN SURGERY  06/2014   melanoma on back removed in dr office  . steroid inj in L elbow      FAMILY HISTORY: Family History  Problem Relation Age of Onset  . Migraines Son     SOCIAL HISTORY: Social History   Socioeconomic History  . Marital status: Married    Spouse name: AnJonni Sanger. Number of children: 3  . Years of education: Post  Grad  . Highest education level: Not on file  Occupational History    Employer: OTHER    Comment: n/a  Social Needs  . Financial resource strain: Not on file  . Food insecurity:    Worry: Not on file    Inability: Not on file  . Transportation needs:    Medical: Not on file    Non-medical: Not on file  Tobacco Use  . Smoking status: Never Smoker  . Smokeless tobacco: Never Used  Substance and Sexual Activity  . Alcohol use: Yes    Comment: 1-2 alcohol drinks on the weekends  . Drug use: Not on file  . Sexual activity: Not on file  Lifestyle  . Physical activity:    Days per week: Not on file    Minutes per session: Not on file  . Stress: Not on file  Relationships  . Social connections:    Talks on phone: Not on file    Gets together: Not on file  Attends religious service: Not on file    Active member of club or organization: Not on file    Attends meetings of clubs or organizations: Not on file    Relationship status: Not on file  . Intimate partner violence:    Fear of current or ex partner: Not on file    Emotionally abused: Not on file    Physically abused: Not on file    Forced sexual activity: Not on file  Other Topics Concern  . Not on file  Social History Narrative   Patient lives at home with her spouse.   Caffeine Use: 1 cup 2-3 times weekly      PHYSICAL EXAM  Vitals:   08/13/17 0842  BP: 120/80  Weight: 141 lb 8 oz (64.2 kg)  Height: _0  (1.6 m)   Body mass index is 25.07 kg/m.  Generalized: Well developed, in no acute distress   Neurological examination  Mentation: Alert oriented to time, place, history taking. Follows all commands speech and language fluent Cranial nerve II-XII: Pupils were equal round reactive to light. Extraocular movements were full, visual field were full on confrontational test. Facial sensation and strength were normal. Uvula tongue midline. Head turning and shoulder shrug  were normal and symmetric. Motor: The  motor testing reveals 5 over 5 strength of all 4 extremities. Good symmetric motor tone is noted throughout.  Sensory: Sensory testing is intact to soft touch on all 4 extremities. No evidence of extinction is noted.  Coordination: Cerebellar testing reveals good finger-nose-finger and heel-to-shin bilaterally.  Gait and station: Gait is normal.  Reflexes: Deep tendon reflexes are symmetric and normal bilaterally.   DIAGNOSTIC DATA (LABS, IMAGING, TESTING) - I reviewed patient records, labs, notes, testing and imaging myself where available.  Lab Results  Component Value Date   WBC 9.6 12/10/2006   HGB 11.9 (L) 12/10/2006   HCT 34.5 (L) 12/10/2006   MCV 87.7 12/10/2006   PLT 226 12/10/2006      Component Value Date/Time   NA 140 12/10/2006 0422   K 4.0 12/10/2006 0422   CL 112 12/10/2006 0422   CO2 24 12/10/2006 0422   GLUCOSE 111 (H) 12/10/2006 0422   BUN 8 12/10/2006 0422   CREATININE 0.79 12/10/2006 0422   CALCIUM 8.8 12/10/2006 0422   GFRNONAA >60 12/10/2006 0422   GFRAA  12/10/2006 0422    >60        The eGFR has been calculated using the MDRD equation. This calculation has not been validated in all clinical     ASSESSMENT AND PLAN 52 y.o. year old female  has a past medical history of Chronic anemia, Hypothyroid, IUD (intrauterine device) in place (07/2013), Melanoma ([place]) (06/2014), and Migraine. here with :  1.  Migraine headache  The patient's headaches have increased in frequency.  She will continue on amitriptyline and sumatriptan.  We discussed starting Aimovig.  The patient is amenable to this plan.  I reviewed potential side effects of this medication with her today and provided her with literature reviewing this medication.  Advised that if her symptoms worsen or she develops new symptoms she should let us know.  She will follow-up in 4 months or sooner if needed.   I spent 15 minutes with the patient. 50% of this time was spent discussing [place], MSN, NP-C 08/13/2017, 8:45 AM [facility] [address] [place], [place] [address] [phone]

## 2017-08-13 NOTE — Patient Instructions (Signed)
Your Plan:  Continue amitriptyline and sumatriptan  Start Aimovig-  Injection every 30 days If your symptoms worsen or you develop new symptoms please let us know.   Thank you for coming to see [place] at [facility]. I hope we have been able to provide you high quality care today.  You may receive a patient satisfaction survey over the next few weeks. We would appreciate your feedback and comments so that we may continue to improve ourselves and the health of our patients.

## 2017-08-13 NOTE — Progress Notes (Signed)
I reviewed note and agree with plan.   Penni Bombard, MD 04/08/6885, 37:30 AM Certified in Neurology, Neurophysiology and Neuroimaging  [facility] [address] [place], [place] [address] [phone]

## 2017-08-19 ENCOUNTER — Telehealth: Payer: Self-pay

## 2017-08-19 NOTE — Telephone Encounter (Signed)
We received a prior authorization request for this medication. I have completed and submitted the PA on Cover My Meds and should have a determination within 48-72 hours.  Cover My Meds Key: [id number]   Aimovig 70mg /mL APPROVED. Effective from 08/19/2017 through 11/16/2017  Pharmacy aware.

## 2017-09-13 DIAGNOSIS — L700 Acne vulgaris: Secondary | ICD-10-CM | POA: Diagnosis not present

## 2017-09-13 DIAGNOSIS — L814 Other melanin hyperpigmentation: Secondary | ICD-10-CM | POA: Diagnosis not present

## 2017-09-13 DIAGNOSIS — D485 Neoplasm of uncertain behavior of skin: Secondary | ICD-10-CM | POA: Diagnosis not present

## 2017-09-13 DIAGNOSIS — D225 Melanocytic nevi of trunk: Secondary | ICD-10-CM | POA: Diagnosis not present

## 2017-09-16 DIAGNOSIS — M25511 Pain in right shoulder: Secondary | ICD-10-CM | POA: Diagnosis not present

## 2017-09-19 ENCOUNTER — Telehealth: Payer: Self-pay | Admitting: Adult Health

## 2017-09-19 NOTE — Telephone Encounter (Signed)
Noted  

## 2017-09-19 NOTE — Telephone Encounter (Signed)
Patient called and wanted to make Megan aware that she accidentally "waisted" her injectable for her migraine medication. The patient is referring to her Aimovig medication. She has made the pharmacy aware and they are going to reach out regarding getting a one time prescription for another one. No need to return patients call unless you have questions.

## 2017-09-24 NOTE — Telephone Encounter (Signed)
Fax confirmation received for amgen product replacement.  For aimovig 70mg /ml. (one pack).  ([facility]).  09-23-17. [phone].

## 2017-10-03 NOTE — Telephone Encounter (Signed)
LMVM for pt to return call at her convenience.  Trying to get more information about the aimovig syringe replacement.

## 2017-10-16 DIAGNOSIS — M25511 Pain in right shoulder: Secondary | ICD-10-CM | POA: Diagnosis not present

## 2017-10-16 DIAGNOSIS — M75120 Complete rotator cuff tear or rupture of unspecified shoulder, not specified as traumatic: Secondary | ICD-10-CM | POA: Diagnosis not present

## 2017-11-04 ENCOUNTER — Telehealth: Payer: Self-pay

## 2017-11-04 NOTE — Telephone Encounter (Signed)
We received a prior authorization request for aimovig 40mg /mL. I have completed and submitted the PA on Cover My Meds and should have a determination within 48-72 hours.  Cover My Meds Key: [id number]

## 2017-11-06 NOTE — Telephone Encounter (Signed)
Received [place] PA approval for Aimovig 70mg  /ml AJ.  11-04-17 thru 11-03-2018.  [phone].  Faxed to [facility] [phone] with fax confirmation received.

## 2017-11-19 DIAGNOSIS — Z6824 Body mass index (BMI) 24.0-24.9, adult: Secondary | ICD-10-CM | POA: Diagnosis not present

## 2017-11-19 DIAGNOSIS — Z1231 Encounter for screening mammogram for malignant neoplasm of breast: Secondary | ICD-10-CM | POA: Diagnosis not present

## 2017-11-19 DIAGNOSIS — Z01419 Encounter for gynecological examination (general) (routine) without abnormal findings: Secondary | ICD-10-CM | POA: Diagnosis not present

## 2017-11-26 DIAGNOSIS — Z13820 Encounter for screening for osteoporosis: Secondary | ICD-10-CM | POA: Diagnosis not present

## 2017-12-03 DIAGNOSIS — M7521 Bicipital tendinitis, right shoulder: Secondary | ICD-10-CM | POA: Diagnosis not present

## 2017-12-03 DIAGNOSIS — S46011A Strain of muscle(s) and tendon(s) of the rotator cuff of right shoulder, initial encounter: Secondary | ICD-10-CM | POA: Diagnosis not present

## 2017-12-03 DIAGNOSIS — G8918 Other acute postprocedural pain: Secondary | ICD-10-CM | POA: Diagnosis not present

## 2017-12-03 DIAGNOSIS — S43431A Superior glenoid labrum lesion of right shoulder, initial encounter: Secondary | ICD-10-CM | POA: Diagnosis not present

## 2017-12-03 DIAGNOSIS — M75121 Complete rotator cuff tear or rupture of right shoulder, not specified as traumatic: Secondary | ICD-10-CM | POA: Diagnosis not present

## 2017-12-03 DIAGNOSIS — Z4889 Encounter for other specified surgical aftercare: Secondary | ICD-10-CM | POA: Diagnosis not present

## 2017-12-03 DIAGNOSIS — M659 Synovitis and tenosynovitis, unspecified: Secondary | ICD-10-CM | POA: Diagnosis not present

## 2017-12-03 DIAGNOSIS — S43491D Other sprain of right shoulder joint, subsequent encounter: Secondary | ICD-10-CM | POA: Diagnosis not present

## 2017-12-03 DIAGNOSIS — M19011 Primary osteoarthritis, right shoulder: Secondary | ICD-10-CM | POA: Diagnosis not present

## 2017-12-03 DIAGNOSIS — R600 Localized edema: Secondary | ICD-10-CM | POA: Diagnosis not present

## 2017-12-03 DIAGNOSIS — M7541 Impingement syndrome of right shoulder: Secondary | ICD-10-CM | POA: Diagnosis not present

## 2017-12-03 HISTORY — PX: ROTATOR CUFF REPAIR: SHX139

## 2017-12-04 ENCOUNTER — Telehealth: Payer: Self-pay | Admitting: Adult Health

## 2017-12-04 MED ORDER — ZOLMITRIPTAN 5 MG NA SOLN: NASAL | 0 refills | Status: DC

## 2017-12-04 NOTE — Telephone Encounter (Signed)
zomig ordered. Imitrex should be discontinued. Should not take imitrex and zomig together.

## 2017-12-04 NOTE — Telephone Encounter (Signed)
Is yesterday was the first day that sumatriptan did not work this could be because she recently had surgery and potentially triggered a migraine.  However if sumatriptan and has not been working at all and we could potentially try Imitrex nasal spray which may work better or Zomig nasal spray

## 2017-12-04 NOTE — Telephone Encounter (Signed)
I called pt back, she said since she last saw you, the imitrex has not been working and she is ok to try one of the nasal sprays.

## 2017-12-04 NOTE — Telephone Encounter (Signed)
Spoke to pt and she had R rotator cuff repair yesterday.  She had bad night, not sleeping well, which makes her migraines worse.  She took her imitrex 3 tabs (yesterday, the 3rd when desperate later on when the 2 had not worked).  She is aware of not to do this, and I reiterated can cause problems.  She took hydrocodone, naproxen and ice for her should pain (and migraine to see if would help).   She is ok now, but is asking if can try something else.  Please advise.  She is taking amitriptyline, aimovig.  She has tried topamax, cambia, nsaids, toprol, she thought maxalt, but I did not see this in notes.

## 2017-12-04 NOTE — Telephone Encounter (Signed)
Pt called stating that medication SUMAtriptan (IMITREX) 100 MG tablet is "no longer helping her", would like to discus with RN. Did not wish to discuss further with me

## 2017-12-04 NOTE — Addendum Note (Signed)
Addended by: Trudie Buckler on: 12/04/2017 04:09 PM   Modules accepted: Orders

## 2017-12-05 NOTE — Telephone Encounter (Signed)
PA initiated on [facility] [id number].  [employer].  Dx G43.009 4-6 migraines/month.  Has tried sumatriptan tabs, nsaids, cambia, topamax, toprol, amitriptyline.

## 2017-12-06 MED ORDER — SUMATRIPTAN 20 MG/ACT NA SOLN: NASAL | 5 refills | Status: DC

## 2017-12-06 NOTE — Telephone Encounter (Signed)
LMVM for pt that new order for imitrex nasal solution escribed for you.  She is to call back if questions concerns.

## 2017-12-06 NOTE — Telephone Encounter (Signed)
PA denied for zomig nasal spray.   Will try imitrex nasal spray. Need to try 2 alternative med on members formulary and not work.  Alternatives:  Sumatriptan nasal spray, zolmitriptan, and naratriptan, and rizatriptan.

## 2017-12-06 NOTE — Addendum Note (Signed)
Addended by: Trudie Buckler on: 12/06/2017 11:59 AM   Modules accepted: Orders

## 2017-12-06 NOTE — Addendum Note (Signed)
Addended by: Brandon Melnick on: 12/06/2017 09:35 AM   Modules accepted: Orders

## 2017-12-11 DIAGNOSIS — M25511 Pain in right shoulder: Secondary | ICD-10-CM | POA: Diagnosis not present

## 2017-12-18 DIAGNOSIS — M25511 Pain in right shoulder: Secondary | ICD-10-CM | POA: Diagnosis not present

## 2017-12-23 ENCOUNTER — Telehealth: Payer: Self-pay

## 2017-12-23 NOTE — Telephone Encounter (Signed)
Pending approval for Zomig 5 mg Key: [id number] Rx number: [id number] ICD 10: W97.915 Tried medications: NSAIDs, Cambia, Topamax and Toprol [facility] [address] [place], [place] [address] Tel: [phone] Fax: [phone]

## 2017-12-25 DIAGNOSIS — M25511 Pain in right shoulder: Secondary | ICD-10-CM | POA: Diagnosis not present

## 2017-12-30 DIAGNOSIS — E039 Hypothyroidism, unspecified: Secondary | ICD-10-CM | POA: Diagnosis not present

## 2018-01-01 DIAGNOSIS — M25511 Pain in right shoulder: Secondary | ICD-10-CM | POA: Diagnosis not present

## 2018-01-06 DIAGNOSIS — M25511 Pain in right shoulder: Secondary | ICD-10-CM | POA: Diagnosis not present

## 2018-01-13 DIAGNOSIS — M25511 Pain in right shoulder: Secondary | ICD-10-CM | POA: Diagnosis not present

## 2018-01-15 DIAGNOSIS — M25511 Pain in right shoulder: Secondary | ICD-10-CM | POA: Diagnosis not present

## 2018-01-20 DIAGNOSIS — M25511 Pain in right shoulder: Secondary | ICD-10-CM | POA: Diagnosis not present

## 2018-01-23 DIAGNOSIS — M25511 Pain in right shoulder: Secondary | ICD-10-CM | POA: Diagnosis not present

## 2018-01-27 DIAGNOSIS — M25511 Pain in right shoulder: Secondary | ICD-10-CM | POA: Diagnosis not present

## 2018-01-30 DIAGNOSIS — M25511 Pain in right shoulder: Secondary | ICD-10-CM | POA: Diagnosis not present

## 2018-02-03 DIAGNOSIS — M25511 Pain in right shoulder: Secondary | ICD-10-CM | POA: Diagnosis not present

## 2018-02-04 ENCOUNTER — Other Ambulatory Visit: Payer: Self-pay | Admitting: Adult Health

## 2018-02-07 DIAGNOSIS — M25511 Pain in right shoulder: Secondary | ICD-10-CM | POA: Diagnosis not present

## 2018-02-12 DIAGNOSIS — M25511 Pain in right shoulder: Secondary | ICD-10-CM | POA: Diagnosis not present

## 2018-02-14 DIAGNOSIS — M25511 Pain in right shoulder: Secondary | ICD-10-CM | POA: Diagnosis not present

## 2018-02-17 ENCOUNTER — Encounter: Payer: Self-pay | Admitting: Adult Health

## 2018-02-17 ENCOUNTER — Ambulatory Visit: Payer: BLUE CROSS/BLUE SHIELD | Admitting: Adult Health

## 2018-02-17 VITALS — BP 106/66 | HR 74 | Wt 143.0 lb

## 2018-02-17 DIAGNOSIS — G43009 Migraine without aura, not intractable, without status migrainosus: Secondary | ICD-10-CM | POA: Diagnosis not present

## 2018-02-17 DIAGNOSIS — M25511 Pain in right shoulder: Secondary | ICD-10-CM | POA: Diagnosis not present

## 2018-02-17 MED ORDER — AMITRIPTYLINE HCL 25 MG PO TABS: 25.0000 mg | ORAL_TABLET | Freq: Every day | ORAL | 3 refills | Status: DC

## 2018-02-17 MED ORDER — SUMATRIPTAN SUCCINATE 100 MG PO TABS: ORAL_TABLET | ORAL | 11 refills | Status: DC

## 2018-02-17 NOTE — Patient Instructions (Signed)
Your Plan:  Continue Aimovig and Amitriptyline Continue Sumatriptan If your symptoms worsen or you develop new symptoms please let us know.    Thank you for coming to see [place] at [facility]. I hope we have been able to provide you high quality care today.  You may receive a patient satisfaction survey over the next few weeks. We would appreciate your feedback and comments so that we may continue to improve ourselves and the health of our patients.

## 2018-02-17 NOTE — Progress Notes (Signed)
PATIENT: Wendy Whitaker DOB: [birth year]  REASON FOR VISIT: follow up HISTORY FROM: patient  HISTORY OF PRESENT ILLNESS: Today 02/17/18:  Wendy Whitaker is a 53 year old female with a history of migraine headaches.  She is currently on amitriptyline.  She was started on Aimovig.  She reports that the first couple months with aimovig she did not notice much change.  She states that then she went 2 months with no migraines.  She states that the next month she gave herself the injection she messed up so she did not receive any medication.  She states for the following month her headaches came back.  She is back on Aimovig and so far she is tolerating it well.  She continues to use oral sumatriptan with good benefit.  She was unable to tolerate nasal Imitrex and Zomig.  She returns today for evaluation.  HISTORY 08/13/17: Wendy Whitaker is a 53 year old female with a history of migraine headaches.  She returns today for follow-up.  She states that her headache frequency has worsened.  She has approximately 4-6 headaches a month.  She states that she is running out of sumatriptan.  She typically always has to take 2 tablets when she gets a headache.  Her headache continues to occur across the forehead and sometimes in the neck.  She denies photophobia and phonophobia.  She does report nausea.  In the past she has tried NSAIDs, Cambia, Topamax and toprol.  She states that she is also going through menopause and unsure if this is the cause of increased headaches.  She returns today for evaluation.  UPDATE (MM) 02/12/17 Wendy Whitaker is a 53 year old female with a history of migraine headaches. She returns today for follow-up. She states that she has approximately 1 week as a month that she will have a headache for 3-4 consecutive days. She states that she no longer has a menstrual cycle as she has an IUD placed butshe feels that the headaches come the same week every month. Therefore she feels that there  may be a hormonal component to her migraines. She states that the headache is typically located across the forehead. She sometimes will have mild photophobia. She denies nausea vomiting. She states that if she takes Imitrex her headache will resolve but typically does return the next day. She states this she wakes to take Imitrex which on occasion she does he will turn into a debilitating migraine. She continues to take amitriptyline 1-1/2 tablets at bedtime. She returns today for an evaluation.  UPDATE 08/06/16: Having 3-5 days migraine per month.Tolerating amitriptyline 37.'5mg'$  at bedtime. Sumatriptan working for now. Hormones may play a role in her headaches.  UPDATE 02/21/15: Since last visit, HA stable. In last 6 months, had 2 x 3-5 day attacks of migraine. Last attack in the past week. Sumatriptan working. Amitriptyline working.   UPDATE 08/18/14: Since last visit, stable. No headaches over 3 months, then no explanation had 4 migraine in last 1 month. These were controlled with sumatriptan. Patient satisfied with headache control.  UPDATE 02/17/14: Since last visit, doing much better on amitriptyline. Sleep is better. HA are improved. Has had 1 or 2 months of headache freedom.  UPDATE 11/17/13: Since last visit, continues with 7-8 migraines per month. Sometimes runs out of sumatriptan before end of month. Reluctant to try propranolol due to side effects of exercise intolerance.   UPDATE 05/18/13: Since last visit, HA frequency is back down to 7-8 per month. Has not tried propranolol yet. She is  satisifed with current migraine freq and control with sumatriptan.  UPDATE 03/05/13: Since last visit, was doing well until few months ago, when migraines increased up to 14-15 days per month. She thinks she is going through menopause, and has recently tried estrogen patch to see if symptoms and HA improve. So far, it seems to be working. Otherwise, sumatriptan and NSAIDs still help with HA  control, but number of days is the main problem (i.e. running out of sumatriptan before end of month).   PRIOR HPI (04/16/12): 53 year old right-handed female, with hypothyroidism, anemia, here for evaluation of migraine headaches. Patient reports history of migraine headaches since her teenage years. She describes global, severe throbbing headaches with mild nausea. No photophobia or phonophobia. No auras. No vision changes. Triggers include menstrual cycle and insomnia. [facility]. Currently she is well-controlled on combination of topiramate 50 mg at night and sumatriptan 100 mg as needed. Presently she is having 5-6 headache days per month. This is a significant improvement compared to a few years ago. Patient is satisfied with her current headache control. Patient has been on higher doses of topiramate in the past. She's tried Toprol, amitriptyline, as well as a variety of NSAIDs and triptans. Family history notable for migraine in her son.     REVIEW OF SYSTEMS: Out of a complete 14 system review of symptoms, the patient complains only of the following symptoms, and all other reviewed systems are negative.  Back pain, itching  ALLERGIES: No Known Allergies  HOME MEDICATIONS: Outpatient Medications Prior to Visit  Medication Sig Dispense Refill  . AIMOVIG 70 MG/ML SOAJ ADMINISTER 1 ML UNDER THE SKIN EVERY 30 DAYS 1 mL 5  . amitriptyline (ELAVIL) 25 MG tablet Take 1-2 tablets (25-50 mg total) by mouth at bedtime. (Patient taking differently: Take 50 mg by mouth at bedtime. ) 180 tablet 3  . levothyroxine (SYNTHROID, LEVOTHROID) 50 MCG tablet Take by mouth at bedtime. Alternating days of '50mg'$  and '75mg'$  daily.    . SUMAtriptan (IMITREX) 20 MG/ACT nasal spray 1 spray in the nostril at the onset of migraine. May repeat in 2 hours if headache persists or recurs.  Max of 2 sprays / 24 hours. 1 Inhaler 5  . zolmitriptan (ZOMIG) 5 MG  nasal solution 1 spray in the nostril at the onset of a migraine. May repeat in 2 hours if needed. Not to exceed 2 sprays in 24 hours (Patient not taking: Reported on 02/17/2018) 6 Units 0   No facility-administered medications prior to visit.     PAST MEDICAL HISTORY: Past Medical History:  Diagnosis Date  . Chronic anemia   . Hypothyroid   . IUD (intrauterine device) in place 07/2013   placed to control menses and related HA  . Melanoma ([place]) 06/2014   back  . Migraine    07/2014 4 migraines    PAST SURGICAL HISTORY: Past Surgical History:  Procedure Laterality Date  . CESAREAN SECTION  1988  . HAND SURGERY     x2  . KNEE SURGERY Right    multiple x's  . ROTATOR CUFF REPAIR Right 12/03/2017  . SKIN SURGERY  06/2014   melanoma on back removed in dr office  . steroid inj in L elbow      FAMILY HISTORY: Family History  Problem Relation Age of Onset  . Migraines Son     SOCIAL HISTORY: Social History   Socioeconomic History  . Marital status: Married  Spouse name: Jonni Sanger  . Number of children: 3  . Years of education: Post Grad  . Highest education level: Not on file  Occupational History    Employer: OTHER    Comment: n/a  Social Needs  . Financial resource strain: Not on file  . Food insecurity:    Worry: Not on file    Inability: Not on file  . Transportation needs:    Medical: Not on file    Non-medical: Not on file  Tobacco Use  . Smoking status: Never Smoker  . Smokeless tobacco: Never Used  Substance and Sexual Activity  . Alcohol use: Yes    Comment: 1-2 alcohol drinks on the weekends  . Drug use: Not on file  . Sexual activity: Not on file  Lifestyle  . Physical activity:    Days per week: Not on file    Minutes per session: Not on file  . Stress: Not on file  Relationships  . Social connections:    Talks on phone: Not on file    Gets together: Not on file    Attends religious service: Not on file    Active member of club or organization:  Not on file    Attends meetings of clubs or organizations: Not on file    Relationship status: Not on file  . Intimate partner violence:    Fear of current or ex partner: Not on file    Emotionally abused: Not on file    Physically abused: Not on file    Forced sexual activity: Not on file  Other Topics Concern  . Not on file  Social History Narrative   Patient lives at home with her spouse.   Caffeine Use: 1 cup 2-3 times weekly      PHYSICAL EXAM  Vitals:   02/17/18 0809  BP: 106/66  Pulse: 74  Weight: 143 lb (64.9 kg)   Body mass index is 25.33 kg/m.  Generalized: Well developed, in no acute distress   Neurological examination  Mentation: Alert oriented to time, place, history taking. Follows all commands speech and language fluent Cranial nerve II-XII: Pupils were equal round reactive to light. Extraocular movements were full, visual field were full on confrontational test. Facial sensation and strength were normal. Uvula tongue midline. Head turning and shoulder shrug  were normal and symmetric. Motor: The motor testing reveals 5 over 5 strength of all 4 extremities. Good symmetric motor tone is noted throughout.  Sensory: Sensory testing is intact to soft touch on all 4 extremities. No evidence of extinction is noted.  Coordination: Cerebellar testing reveals good finger-nose-finger and heel-to-shin bilaterally.  Gait and station: Gait is normal. Tandem gait is normal. Romberg is negative. No drift is seen.  Reflexes: Deep tendon reflexes are symmetric and normal bilaterally.   DIAGNOSTIC DATA (LABS, IMAGING, TESTING) - I reviewed patient records, labs, notes, testing and imaging myself where available.  Lab Results  Component Value Date   WBC 9.6 12/10/2006   HGB 11.9 (L) 12/10/2006   HCT 34.5 (L) 12/10/2006   MCV 87.7 12/10/2006   PLT 226 12/10/2006      Component Value Date/Time   NA 140 12/10/2006 0422   K 4.0 12/10/2006 0422   CL 112 12/10/2006 0422    CO2 24 12/10/2006 0422   GLUCOSE 111 (H) 12/10/2006 0422   BUN 8 12/10/2006 0422   CREATININE 0.79 12/10/2006 0422   CALCIUM 8.8 12/10/2006 0422   GFRNONAA >60 12/10/2006 0422   GFRAA  12/10/2006 0422    >60        The eGFR has been calculated using the MDRD equation. This calculation has not been validated in all clinical    ASSESSMENT AND PLAN 53 y.o. year old female  has a past medical history of Chronic anemia, Hypothyroid, IUD (intrauterine device) in place (07/2013), Melanoma ([place]) (06/2014), and Migraine. here with:  1.  Migraine headaches  The patient will continue on Aimovig and sumatriptan.  I have advised that if her headache frequency or severity worsen she should let us know.  She will follow-up in 6 months or sooner if needed.   I spent 15 minutes with the patient. 50% of this time was spent reviewing her plan of care   Ward Givens, MSN, NP-C 02/17/2018, 8:13 AM [facility] [address] [place], [place] [address] [phone]

## 2018-02-19 NOTE — Progress Notes (Signed)
I reviewed note and agree with plan.   Penni Bombard, MD 08/26/8286, 3:37 PM Certified in Neurology, Neurophysiology and Neuroimaging  [facility] [address], [place], [place] [address] [phone]

## 2018-02-21 DIAGNOSIS — M25511 Pain in right shoulder: Secondary | ICD-10-CM | POA: Diagnosis not present

## 2018-02-26 DIAGNOSIS — M25511 Pain in right shoulder: Secondary | ICD-10-CM | POA: Diagnosis not present

## 2018-02-28 DIAGNOSIS — M25511 Pain in right shoulder: Secondary | ICD-10-CM | POA: Diagnosis not present

## 2018-03-04 DIAGNOSIS — M25511 Pain in right shoulder: Secondary | ICD-10-CM | POA: Diagnosis not present

## 2018-03-06 DIAGNOSIS — M25511 Pain in right shoulder: Secondary | ICD-10-CM | POA: Diagnosis not present

## 2018-03-13 DIAGNOSIS — M25511 Pain in right shoulder: Secondary | ICD-10-CM | POA: Diagnosis not present

## 2018-03-17 DIAGNOSIS — D225 Melanocytic nevi of trunk: Secondary | ICD-10-CM | POA: Diagnosis not present

## 2018-03-17 DIAGNOSIS — L814 Other melanin hyperpigmentation: Secondary | ICD-10-CM | POA: Diagnosis not present

## 2018-03-17 DIAGNOSIS — L300 Nummular dermatitis: Secondary | ICD-10-CM | POA: Diagnosis not present

## 2018-03-17 DIAGNOSIS — D2371 Other benign neoplasm of skin of right lower limb, including hip: Secondary | ICD-10-CM | POA: Diagnosis not present

## 2018-03-19 ENCOUNTER — Other Ambulatory Visit: Payer: Self-pay | Admitting: Adult Health

## 2018-03-19 DIAGNOSIS — M25511 Pain in right shoulder: Secondary | ICD-10-CM | POA: Diagnosis not present

## 2018-03-26 DIAGNOSIS — M25511 Pain in right shoulder: Secondary | ICD-10-CM | POA: Diagnosis not present

## 2018-04-02 DIAGNOSIS — M25511 Pain in right shoulder: Secondary | ICD-10-CM | POA: Diagnosis not present

## 2018-05-19 DIAGNOSIS — E039 Hypothyroidism, unspecified: Secondary | ICD-10-CM | POA: Diagnosis not present

## 2018-07-02 ENCOUNTER — Telehealth: Payer: Self-pay

## 2018-07-02 NOTE — Telephone Encounter (Signed)
Spoke with the patient and they have given verbal consent to file their insurance and to do a doxy[employer]me visit. E-mail, mobile number and carrier have been confirmed and sent.  E-mail:[email or web address] Text: [phone] Youth worker)

## 2018-07-08 ENCOUNTER — Ambulatory Visit: Payer: Self-pay | Admitting: Family Medicine

## 2018-07-09 NOTE — Progress Notes (Signed)
PATIENT: Wendy Whitaker DOB: 22-Jul-1965  REASON FOR VISIT: follow up HISTORY FROM: patient  Virtual Visit via Telephone Note  I connected with Wendy Whitaker on 07/10/18 at  9:00 AM EDT by telephone and verified that I am speaking with the correct person using two identifiers.   I discussed the limitations, risks, security and privacy concerns of performing an evaluation and management service by telephone and the availability of in person appointments. I also discussed with the patient that there may be a patient responsible charge related to this service. The patient expressed understanding and agreed to proceed.   History of Present Illness:  07/10/18 Wendy Whitaker is a 53 y.o. female here today for follow up of migraines.  She continues preventative therapy with Aimovig 70 mg monthly and amitriptyline 50 mg at bedtime.  She reports initial improvement in her migraines but feels that over the past few months migraines have increased in frequency.  Migraines are similar to those in the past but are occurring more frequently.  She states that she is having to use a 4 months worth of sumatriptan each month.  She does feel that migraines tend to cluster around a certain time of the month.  She is uncertain if this is related to her hormones.  She no longer has a menstrual cycle.  Migraines tend to persist for several days at a time.  She is tolerating medications well without obvious adverse effects.   HISTORY (copied from [facility] note on 02/17/2018)  [name] is a 53 year old female with a history of migraine headaches.  She is currently on amitriptyline.  She was started on Aimovig.  She reports that the first couple months with aimovig she did not notice much change.  She states that then she went 2 months with no migraines.  She states that the next month she gave herself the injection she messed up so she did not receive any medication.  She states for the following month her  headaches came back.  She is back on Aimovig and so far she is tolerating it well.  She continues to use oral sumatriptan with good benefit.  She was unable to tolerate nasal Imitrex and Zomig.  She returns today for evaluation.  HISTORY 08/13/17: [name] is a 53 year old female with a history of migraine headaches. She returns today for follow-up. She states that her headache frequency has worsened. She has approximately 4-6 headaches a month. She states that she is running out of sumatriptan. She typically always has to take 2 tablets when she gets a headache. Her headache continues to occur across the forehead and sometimes in the neck. She denies photophobia and phonophobia. She does report nausea. In the past she has tried NSAIDs, Cambia, Topamax and toprol.She states that she is also going through menopauseand unsure if this is the cause ofincreased headaches. She returns today for evaluation.  UPDATE (MM)02/12/17 [name] is a 53 year old female with a history of migraine headaches. She returns today for follow-up. She states that she has approximately 1 week as a month that she will have a headache for 3-4 consecutive days. She states that she no longer has a menstrual cycle as she has an IUD placed butshe feels that the headaches come the same week every month. Therefore she feels that there may be a hormonal component to her migraines. She states that the headache is typically located across the forehead. She sometimes will have mild photophobia. She denies nausea vomiting. She states  that if she takes Imitrex her headache will resolve but typically does return the next day. She states this she wakes to take Imitrex which on occasion she does he will turn into a debilitating migraine. She continues to take amitriptyline 1-1/2 tablets at bedtime. She returns today for an evaluation.  UPDATE 08/06/16: Having 3-5 days migraine per month.Tolerating amitriptyline  37.5mg  at bedtime. Sumatriptan working for now. Hormones may play a role in her headaches.  UPDATE 02/21/15: Since last visit, HA stable. In last 6 months, had 2 x 3-5 day attacks of migraine. Last attack in the past week. Sumatriptan working. Amitriptyline working.   UPDATE 08/18/14: Since last visit, stable. No headaches over 3 months, then no explanation had 4 migraine in last 1 month. These were controlled with sumatriptan. Patient satisfied with headache control.  UPDATE 02/17/14: Since last visit, doing much better on amitriptyline. Sleep is better. HA are improved. Has had 1 or 2 months of headache freedom.  UPDATE 11/17/13: Since last visit, continues with 7-8 migraines per month. Sometimes runs out of sumatriptan before end of month. Reluctant to try propranolol due to side effects of exercise intolerance.   UPDATE 05/18/13: Since last visit, HA frequency is back down to 7-8 per month. Has not tried propranolol yet. She is satisifed with current migraine freq and control with sumatriptan.  UPDATE 03/05/13: Since last visit, was doing well until few months ago, when migraines increased up to 14-15 days per month. She thinks she is going through menopause, and has recently tried estrogen patch to see if symptoms and HA improve. So far, it seems to be working. Otherwise, sumatriptan and NSAIDs still help with HA control, but number of days is the main problem (i.e. running out of sumatriptan before end of month).   PRIOR HPI (04/16/12): 53 year old right-handed female, with hypothyroidism, anemia, here for evaluation of migraine headaches. Patient reports history of migraine headaches since her teenage years. She describes global, severe throbbing headaches with mild nausea. No photophobia or phonophobia. No auras. No vision changes. Triggers include menstrual cycle and insomnia. [facility]. Currently she is well-controlled on  combination of topiramate 50 mg at night and sumatriptan 100 mg as needed. Presently she is having 5-6 headache days per month. This is a significant improvement compared to a few years ago. Patient is satisfied with her current headache control. Patient has been on higher doses of topiramate in the past. She's tried Toprol, amitriptyline, as well as a variety of NSAIDs and triptans. Family history notable for migraine in her son.   Observations/Objective:  Generalized: Well developed, in no acute distress  Mentation: Alert oriented to time, place, history taking. Follows all commands speech and language fluent   Assessment and Plan:  53 y.o. year old female  has a past medical history of Chronic anemia, Hypothyroid, IUD (intrauterine device) in place (07/2013), Melanoma ([place]) (06/2014), and Migraine. here with    ICD-10-CM   1. Migraine without aura and without status migrainosus, not intractable G43.009 Erenumab-aooe 140 MG/ML SOAJ    amitriptyline (ELAVIL) 25 MG tablet    SUMAtriptan (IMITREX) 100 MG tablet   Mrs. Spizzirri has experienced an increase in frequency of her migraines over the last few months.  We will increase Aimovig to 140 mg monthly.  I have encouraged her to continue amitriptyline 50 mg at bedtime.  We have discussed complementary treatments for migraines as well.  I recommend a 46-month follow-up  to ensure adequate response to increased dose of Aimovig.  She was advised to call should migraines continue to persist.  She verbalizes understanding and agreement with this plan.  No orders of the defined types were placed in this encounter.   Meds ordered this encounter  Medications   Erenumab-aooe 140 MG/ML SOAJ    Sig: Inject 140 mg into the skin every 30 (thirty) days.    Dispense:  3 pen    Refill:  3    Order Specific Question:   Supervising Provider    Answer:   Melvenia Beam [[id number]]   amitriptyline (ELAVIL) 25 MG tablet    Sig: Take 1-2 tablets (25-50 mg  total) by mouth at bedtime.    Dispense:  180 tablet    Refill:  3    Order Specific Question:   Supervising Provider    Answer:   Melvenia Beam [[id number]]   SUMAtriptan (IMITREX) 100 MG tablet    Sig: Take 1 tab at the onset of migraine can repeat in 2 hours if needed. Not to exceed 2 tabs/24 hours.    Dispense:  10 tablet    Refill:  11    Must last 28 days    Order Specific Question:   Supervising Provider    Answer:   Melvenia Beam [[id number]]     Follow Up Instructions:  I discussed the assessment and treatment plan with the patient. The patient was provided an opportunity to ask questions and all were answered. The patient agreed with the plan and demonstrated an understanding of the instructions.   The patient was advised to call back or seek an in-person evaluation if the symptoms worsen or if the condition fails to improve as anticipated.  I provided 20 minutes of non-face-to-face time during this encounter.  Patient is located at her place of residence during video conference.  Provider is located in the office.  Maryelizabeth Kaufmann, CMA helped to facilitate visit.   Debbora Presto, NP

## 2018-07-10 ENCOUNTER — Other Ambulatory Visit: Payer: Self-pay

## 2018-07-10 ENCOUNTER — Encounter: Payer: Self-pay | Admitting: Family Medicine

## 2018-07-10 ENCOUNTER — Ambulatory Visit (INDEPENDENT_AMBULATORY_CARE_PROVIDER_SITE_OTHER): Payer: BC Managed Care – PPO | Admitting: Family Medicine

## 2018-07-10 DIAGNOSIS — G43009 Migraine without aura, not intractable, without status migrainosus: Secondary | ICD-10-CM | POA: Diagnosis not present

## 2018-07-10 MED ORDER — ERENUMAB-AOOE 140 MG/ML ~~LOC~~ SOAJ: 140.0000 mg | SUBCUTANEOUS | 3 refills | Status: DC

## 2018-07-10 MED ORDER — SUMATRIPTAN SUCCINATE 100 MG PO TABS: ORAL_TABLET | ORAL | 11 refills | Status: DC

## 2018-07-10 MED ORDER — AMITRIPTYLINE HCL 25 MG PO TABS: 25.0000 mg | ORAL_TABLET | Freq: Every day | ORAL | 3 refills | Status: DC

## 2018-07-17 NOTE — Progress Notes (Signed)
I reviewed note and agree with plan.   Penni Bombard, MD 07/05/1038, 4:59 PM Certified in Neurology, Neurophysiology and Neuroimaging  [facility] [address] [place], [place] [address] [phone]

## 2018-08-26 ENCOUNTER — Ambulatory Visit: Payer: BLUE CROSS/BLUE SHIELD | Admitting: Adult Health

## 2018-09-25 DIAGNOSIS — L821 Other seborrheic keratosis: Secondary | ICD-10-CM | POA: Diagnosis not present

## 2018-09-25 DIAGNOSIS — L308 Other specified dermatitis: Secondary | ICD-10-CM | POA: Diagnosis not present

## 2018-09-25 DIAGNOSIS — D225 Melanocytic nevi of trunk: Secondary | ICD-10-CM | POA: Diagnosis not present

## 2018-09-25 DIAGNOSIS — D1801 Hemangioma of skin and subcutaneous tissue: Secondary | ICD-10-CM | POA: Diagnosis not present

## 2018-10-02 DIAGNOSIS — Z23 Encounter for immunization: Secondary | ICD-10-CM | POA: Diagnosis not present

## 2018-10-02 DIAGNOSIS — Z0000 Encounter for general adult medical examination without abnormal findings: Secondary | ICD-10-CM | POA: Diagnosis not present

## 2018-10-09 ENCOUNTER — Telehealth: Payer: Self-pay | Admitting: Family Medicine

## 2018-10-09 NOTE — Telephone Encounter (Signed)
I called patient and received consent to convert her 9/8 appointment to a MyChart virtual visit, due to schedule changes. Patient understands how MyChart visit will work.

## 2018-10-14 ENCOUNTER — Other Ambulatory Visit: Payer: Self-pay

## 2018-10-14 ENCOUNTER — Telehealth (INDEPENDENT_AMBULATORY_CARE_PROVIDER_SITE_OTHER): Payer: BC Managed Care – PPO | Admitting: Family Medicine

## 2018-10-14 DIAGNOSIS — G43009 Migraine without aura, not intractable, without status migrainosus: Secondary | ICD-10-CM

## 2018-10-14 MED ORDER — UBRELVY 100 MG PO TABS: 100.0000 mg | ORAL_TABLET | Freq: Every day | ORAL | 11 refills | Status: DC | PRN

## 2018-10-14 NOTE — Progress Notes (Signed)
PATIENT: Wendy Whitaker DOB: 1965-08-02  REASON FOR VISIT: follow up HISTORY FROM: patient  Virtual Visit via Telephone Note  I connected with Wendy Whitaker on 10/14/18 at  9:00 AM EDT by telephone and verified that I am speaking with the correct person using two identifiers.   I discussed the limitations, risks, security and privacy concerns of performing an evaluation and management service by telephone and the availability of in person appointments. I also discussed with the patient that there may be a patient responsible charge related to this service. The patient expressed understanding and agreed to proceed.   History of Present Illness:  10/14/18 Wendy Whitaker is a 53 y.o. female here today for follow up for migraines.  She has noted improvement in migraine frequency and intensity since increasing Aimovig to 1 her 40 mg monthly.  She continues amitriptyline 50 mg at bedtime.  She reports that sumatriptan does help some with abortive therapy but she typically needs 2 doses of medication.  On average she feels that she is having 4-8 drinks per month.  They continue to cluster around the same time each month.  She is also noted that weather changes are a trigger.  She denies allergy symptoms.  She is feeling well and able to do all the things she enjoys.  History (copied from my note on 07/10/2018)  Wendy Whitaker is a 53 y.o. female here today for follow up of migraines.  She continues preventative therapy with Aimovig 70 mg monthly and amitriptyline 50 mg at bedtime.  She reports initial improvement in her migraines but feels that over the past few months migraines have increased in frequency.  Migraines are similar to those in the past but are occurring more frequently.  She states that she is having to use a 4 months worth of sumatriptan each month.  She does feel that migraines tend to cluster around a certain time of the month.  She is uncertain if this is related to her hormones.   She no longer has a menstrual cycle.  Migraines tend to persist for several days at a time.  She is tolerating medications well without obvious adverse effects.  HISTORY (copied from [facility] note on 02/17/2018)  Wendy Whitaker is a 53 year old female with a history of migraine headaches. She is currently on amitriptyline. She was started on Aimovig. She reports that the first couple months with aimovigshe did not notice much change. She states that then she went 2 months with no migraines. She states that the next month she gaveherself the injection she messed upso she did not receive any medication. She states for the following month her headaches came back. She is back on Aimovig and so far she is tolerating it well. She continues to use oral sumatriptan with good benefit. She was unable to tolerate nasal Imitrex and Zomig. She returns today for evaluation.  HISTORY07/09/19: Wendy Whitaker is a 53 year old female with a history of migraine headaches. She returns today for follow-up. She states that her headache frequency has worsened. She has approximately 4-6 headaches a month. She states that she is running out of sumatriptan. She typically always has to take 2 tablets when she gets a headache. Her headache continues to occur across the forehead and sometimes in the neck. She denies photophobia and phonophobia. She does report nausea. In the past she has tried NSAIDs, Cambia, Topamax and toprol.She states that she is also going through menopauseand unsure if this is the cause ofincreased headaches. She  returns today for evaluation.  UPDATE (MM)02/12/17 Wendy Whitaker is a 53 year old female with a history of migraine headaches. She returns today for follow-up. She states that she has approximately 1 week as a month that she will have a headache for 3-4 consecutive days. She states that she no longer has a menstrual cycle as she has an IUD placed butshe feels that  the headaches come the same week every month. Therefore she feels that there may be a hormonal component to her migraines. She states that the headache is typically located across the forehead. She sometimes will have mild photophobia. She denies nausea vomiting. She states that if she takes Imitrex her headache will resolve but typically does return the next day. She states this she wakes to take Imitrex which on occasion she does he will turn into a debilitating migraine. She continues to take amitriptyline 1-1/2 tablets at bedtime. She returns today for an evaluation.  UPDATE 08/06/16: Having 3-5 days migraine per month.Tolerating amitriptyline 37.5mg  at bedtime. Sumatriptan working for now. Hormones may play a role in her headaches.  UPDATE 02/21/15: Since last visit, HA stable. In last 6 months, had 2 x 3-5 day attacks of migraine. Last attack in the past week. Sumatriptan working. Amitriptyline working.   UPDATE 08/18/14: Since last visit, stable. No headaches over 3 months, then no explanation had 4 migraine in last 1 month. These were controlled with sumatriptan. Patient satisfied with headache control.  UPDATE 02/17/14: Since last visit, doing much better on amitriptyline. Sleep is better. HA are improved. Has had 1 or 2 months of headache freedom.  UPDATE 11/17/13: Since last visit, continues with 7-8 migraines per month. Sometimes runs out of sumatriptan before end of month. Reluctant to try propranolol due to side effects of exercise intolerance.   UPDATE 05/18/13: Since last visit, HA frequency is back down to 7-8 per month. Has not tried propranolol yet. She is satisifed with current migraine freq and control with sumatriptan.  UPDATE 03/05/13: Since last visit, was doing well until few months ago, when migraines increased up to 14-15 days per month. She thinks she is going through menopause, and has recently tried estrogen patch to see if symptoms and HA improve. So far, it  seems to be working. Otherwise, sumatriptan and NSAIDs still help with HA control, but number of days is the main problem (i.e. running out of sumatriptan before end of month).   PRIOR HPI (04/16/12): 53 year old right-handed female, with hypothyroidism, anemia, here for evaluation of migraine headaches. Patient reports history of migraine headaches since her teenage years. She describes global, severe throbbing headaches with mild nausea. No photophobia or phonophobia. No auras. No vision changes. Triggers include menstrual cycle and insomnia. [facility]. Currently she is well-controlled on combination of topiramate 50 mg at night and sumatriptan 100 mg as needed. Presently she is having 5-6 headache days per month. This is a significant improvement compared to a few years ago. Patient is satisfied with her current headache control. Patient has been on higher doses of topiramate in the past. She's tried Toprol, amitriptyline, as well as a variety of NSAIDs and triptans. Family history notable for migraine in her son.   Observations/Objective:  Generalized: Well developed, in no acute distress  Mentation: Alert oriented to time, place, history taking. Follows all commands speech and language fluent   Assessment and Plan:  53 y.o. year old female  has a past medical history of  Chronic anemia, Hypothyroid, IUD (intrauterine device) in place (07/2013), Melanoma ([place]) (06/2014), and Migraine. here with    ICD-10-CM   1. Migraine without aura and without status migrainosus, not intractable  G43.009    Amandeep feels that migraines have improved since increasing Aimovig to 140 mg.  We will continue Aimovig 140 mg monthly as well as amitriptyline 50 mg at bedtime.  I have suggested she try Ubrelvy for abortive therapy.  She was instructed to take 1 tablet at onset of headache; may repeat 1 tablet in 2 hours.  No more than 2 tablets in 24 hours or  10 tablets/month.  She will not sumatriptan with Roselyn Meier.  She was advised to follow-up should headaches worsen.  We will plan annual follow-up, sooner if needed.  She verbalizes understanding and agreement with this plan.   No orders of the defined types were placed in this encounter.   Meds ordered this encounter  Medications   Ubrogepant (UBRELVY) 100 MG TABS    Sig: Take 100 mg by mouth daily as needed. Take one tablet at onset of headache, may repeat 1 tablet in 2 hours, no more than 2 tablets in 24 hours    Dispense:  10 tablet    Refill:  11    Order Specific Question:   Supervising Provider    Answer:   Melvenia Beam [id number]     Follow Up Instructions:  I discussed the assessment and treatment plan with the patient. The patient was provided an opportunity to ask questions and all were answered. The patient agreed with the plan and demonstrated an understanding of the instructions.   The patient was advised to call back or seek an in-person evaluation if the symptoms worsen or if the condition fails to improve as anticipated.  I provided 15 minutes of non-face-to-face time during this encounter.  [place] the golf [place] during FaceTime visit.  She was unable to log into my chart account.  Provider is in the office.   Debbora Presto, NP

## 2018-10-14 NOTE — Progress Notes (Signed)
I reviewed note and agree with plan.   Penni Bombard, MD [id number], [id number] AM Certified in Neurology, Neurophysiology and Neuroimaging  [facility] [address], [place], [place] [address] [phone]

## 2018-10-16 ENCOUNTER — Encounter: Payer: Self-pay | Admitting: *Deleted

## 2018-10-16 ENCOUNTER — Telehealth: Payer: Self-pay | Admitting: *Deleted

## 2018-10-16 NOTE — Telephone Encounter (Signed)
Tyson Babinski PA on CMM, key: [id number]. Dx: [id number]. Failed: sumatriptan, topamax, zomig, nasal imitrex, NSAIDS, Toprol, Amitriptyline, Cambia. PA submitted and approved: Effective from 10/16/2018 through 01/07/2019. Approval faxed to [address]. Patient notified via my chart.

## 2018-10-23 ENCOUNTER — Telehealth: Payer: Self-pay | Admitting: *Deleted

## 2018-10-23 NOTE — Telephone Encounter (Signed)
Initiated [place] # [id number]. For aimovig 140mg  /ml.

## 2018-10-28 ENCOUNTER — Encounter: Payer: Self-pay | Admitting: *Deleted

## 2018-10-28 NOTE — Telephone Encounter (Addendum)
Aimovig 140 mg approved with [employer], 10/23/18 - 10/22/19, ref [id number]. Approval letter faxed to [place]n [place]. Notified pt via my chart.

## 2018-11-15 ENCOUNTER — Telehealth: Payer: Self-pay | Admitting: Neurology

## 2018-11-15 ENCOUNTER — Emergency Department (HOSPITAL_COMMUNITY)
Admission: EM | Admit: 2018-11-15 | Discharge: 2018-11-15 | Disposition: A | Discharge status: Home / Self Care | Payer: BC Managed Care – PPO | Attending: Emergency Medicine | Admitting: Emergency Medicine

## 2018-11-15 ENCOUNTER — Encounter (HOSPITAL_COMMUNITY): Payer: Self-pay

## 2018-11-15 ENCOUNTER — Other Ambulatory Visit: Payer: Self-pay

## 2018-11-15 DIAGNOSIS — Z79899 Other long term (current) drug therapy: Secondary | ICD-10-CM | POA: Insufficient documentation

## 2018-11-15 DIAGNOSIS — R509 Fever, unspecified: Secondary | ICD-10-CM | POA: Diagnosis present

## 2018-11-15 DIAGNOSIS — E039 Hypothyroidism, unspecified: Secondary | ICD-10-CM | POA: Diagnosis not present

## 2018-11-15 DIAGNOSIS — N12 Tubulo-interstitial nephritis, not specified as acute or chronic: Secondary | ICD-10-CM

## 2018-11-15 LAB — URINALYSIS, ROUTINE W REFLEX MICROSCOPIC
Bilirubin Urine: NEGATIVE
Glucose, UA: NEGATIVE mg/dL
Ketones, ur: NEGATIVE mg/dL
Nitrite: POSITIVE — AB
Protein, ur: 100 mg/dL — AB
Specific Gravity, Urine: 1.011 (ref 1.005–1.030)
WBC, UA: >50 WBC/hpf — ABNORMAL HIGH (ref 0–5)
pH: 5 (ref 5.0–8.0)

## 2018-11-15 LAB — BASIC METABOLIC PANEL
Anion gap: 12 (ref 5–15)
BUN: 19 mg/dL (ref 6–20)
CO2: 23 mmol/L (ref 22–32)
Calcium: 8.7 mg/dL — ABNORMAL LOW (ref 8.9–10.3)
Chloride: 99 mmol/L (ref 98–111)
Creatinine, Ser: 1.05 mg/dL — ABNORMAL HIGH (ref 0.44–1.00)
GFR calc Af Amer: >60 mL/min (ref >60)
GFR calc non Af Amer: 60 mL/min — ABNORMAL LOW (ref >60)
Glucose, Bld: 142 mg/dL — ABNORMAL HIGH (ref 70–99)
Potassium: 3.6 mmol/L (ref 3.5–5.1)
Sodium: 134 mmol/L — ABNORMAL LOW (ref 135–145)

## 2018-11-15 LAB — CBC WITH DIFFERENTIAL/PLATELET
Abs Immature Granulocytes: 0.05 10*3/uL (ref 0.00–0.07)
Basophils Absolute: 0 10*3/uL (ref 0.0–0.1)
Basophils Relative: 0 %
Eosinophils Absolute: 0 10*3/uL (ref 0.0–0.5)
Eosinophils Relative: 0 %
HCT: 37.4 % (ref 36.0–46.0)
Hemoglobin: 12 g/dL (ref 12.0–15.0)
Immature Granulocytes: 0 %
Lymphocytes Relative: 6 %
Lymphs Abs: 0.7 10*3/uL (ref 0.7–4.0)
MCH: 27.5 pg (ref 26.0–34.0)
MCHC: 32.1 g/dL (ref 30.0–36.0)
MCV: 85.8 fL (ref 80.0–100.0)
Monocytes Absolute: 1.4 10*3/uL — ABNORMAL HIGH (ref 0.1–1.0)
Monocytes Relative: 12 %
Neutro Abs: 9.4 10*3/uL — ABNORMAL HIGH (ref 1.7–7.7)
Neutrophils Relative %: 82 %
Platelets: 226 10*3/uL (ref 150–400)
RBC: 4.36 MIL/uL (ref 3.87–5.11)
RDW: 15.4 % (ref 11.5–15.5)
WBC: 11.6 10*3/uL — ABNORMAL HIGH (ref 4.0–10.5)
nRBC: 0 % (ref 0.0–0.2)

## 2018-11-15 MED ORDER — SODIUM CHLORIDE 0.9 % IV SOLN
1.0000 g | Freq: Once | INTRAVENOUS | Status: AC
  Administered 2018-11-15: 1 g via INTRAVENOUS
  Filled 2018-11-15: qty 10

## 2018-11-15 MED ORDER — ACETAMINOPHEN 325 MG PO TABS
650.0000 mg | ORAL_TABLET | Freq: Once | ORAL | Status: AC | PRN
  Administered 2018-11-15: 650 mg via ORAL
  Filled 2018-11-15: qty 2

## 2018-11-15 MED ORDER — CEPHALEXIN 500 MG PO CAPS: 500.0000 mg | ORAL_CAPSULE | Freq: Three times a day (TID) | ORAL | 0 refills | Status: DC

## 2018-11-15 MED ORDER — DEXAMETHASONE 2 MG PO TABS: ORAL_TABLET | ORAL | 0 refills | Status: DC

## 2018-11-15 MED ORDER — LACTATED RINGERS IV BOLUS
1000.0000 mL | Freq: Once | INTRAVENOUS | Status: AC
  Administered 2018-11-15: 1000 mL via INTRAVENOUS

## 2018-11-15 NOTE — ED Triage Notes (Signed)
She c/o flank pain and waxing/waning fever x 1 day. She is in no distress.

## 2018-11-15 NOTE — ED Provider Notes (Signed)
[facility] DEPT Provider Note   CSN: [id number] Arrival date & time: 11/15/18  1812     History   Chief Complaint Chief Complaint  Patient presents with  . Fever  . Flank Pain    HPI Nataliee Rolstad is a 53 y.o. female.     HPI   53 year old female with left mid to lower back pain, fatigue and fever.  Progressively worsening symptoms for the past 3 to 4 days.  Increased urinary frequency.  No dysuria.  Nausea but no vomiting.  No unusual vaginal bleeding or discharge.  No change in her bowel movements.  No sick contacts that she is aware of.  No respiratory complaints.  Past Medical History:  Diagnosis Date  . Chronic anemia   . Hypothyroid   . IUD (intrauterine device) in place 07/2013   placed to control menses and related HA  . Melanoma ([place]) 06/2014   back  . Migraine    07/2014 4 migraines    Patient Active Problem List   Diagnosis Date Noted  . Migraine without aura and without status migrainosus, not intractable 11/17/2013    Past Surgical History:  Procedure Laterality Date  . CESAREAN SECTION  1988  . HAND SURGERY     x2  . KNEE SURGERY Right    multiple x's  . ROTATOR CUFF REPAIR Right 12/03/2017  . SKIN SURGERY  06/2014   melanoma on back removed in dr office  . steroid inj in L elbow       OB History   No obstetric history on file.      Home Medications    Prior to Admission medications   Medication Sig Start Date End Date Taking? Authorizing Provider  amitriptyline (ELAVIL) 25 MG tablet Take 1-2 tablets (25-50 mg total) by mouth at bedtime. 07/10/18   Lomax, Amy, NP  dexamethasone (DECADRON) 2 MG tablet Take 3 tablets the first day, 2 the second and 1 the third day 11/15/18   Kathrynn Ducking, MD  Erenumab-aooe 140 MG/ML SOAJ Inject 140 mg into the skin every 30 (thirty) days. 07/10/18   Lomax, Amy, NP  levothyroxine (SYNTHROID, LEVOTHROID) 50 MCG tablet Take by mouth at bedtime. Alternating days of 50mg  and 75mg   daily. 12/22/12   [provider]  SUMAtriptan (IMITREX) 100 MG tablet Take 1 tab at the onset of migraine can repeat in 2 hours if needed. Not to exceed 2 tabs/24 hours. 07/10/18   Lomax, Amy, NP  Ubrogepant (UBRELVY) 100 MG TABS Take 100 mg by mouth daily as needed. Take one tablet at onset of headache, may repeat 1 tablet in 2 hours, no more than 2 tablets in 24 hours 10/14/18   Lomax, Amy, NP    Family History Family History  Problem Relation Age of Onset  . Migraines Son     Social History Social History   Tobacco Use  . Smoking status: Never Smoker  . Smokeless tobacco: Never Used  Substance Use Topics  . Alcohol use: Yes    Comment: 1-2 alcohol drinks on the weekends  . Drug use: Not on file     Allergies   Patient has no known allergies.   Review of Systems Review of Systems  All systems reviewed and negative, other than as noted in HPI.  Physical Exam Updated Vital Signs BP 116/81 (BP Location: Left Arm)   Pulse (!) 108   Temp (!) 103.5 F (39.7 C) (Oral)   Resp 18  LMP 03/20/2014   SpO2 96%   Physical Exam Vitals signs and nursing note reviewed.  Constitutional:      General: She is not in acute distress.    Appearance: She is well-developed.  HENT:     Head: Normocephalic and atraumatic.  Eyes:     General:        Right eye: No discharge.        Left eye: No discharge.     Conjunctiva/sclera: Conjunctivae normal.  Neck:     Musculoskeletal: Neck supple.  Cardiovascular:     Rate and Rhythm: Normal rate and regular rhythm.     Heart sounds: Normal heart sounds. No murmur. No friction rub. No gallop.   Pulmonary:     Effort: Pulmonary effort is normal. No respiratory distress.     Breath sounds: Normal breath sounds.  Abdominal:     General: There is no distension.     Palpations: Abdomen is soft.     Tenderness: There is abdominal tenderness.     Comments: Mild suprapubic tenderness without rebound or guarding.  No distention.   Genitourinary:    Comments: Left CVA tenderness Musculoskeletal:        General: No tenderness.  Skin:    General: Skin is warm and dry.  Neurological:     Mental Status: She is alert.  Psychiatric:        Behavior: Behavior normal.        Thought Content: Thought content normal.      ED Treatments / Results  Labs (all labs ordered are listed, but only abnormal results are displayed) Labs Reviewed  URINALYSIS, ROUTINE W REFLEX MICROSCOPIC - Abnormal; Notable for the following components:      Result Value   APPearance CLOUDY (*)    Hgb urine dipstick LARGE (*)    Protein, ur 100 (*)    Nitrite POSITIVE (*)    Leukocytes,Ua LARGE (*)    WBC, UA >50 (*)    Bacteria, UA MANY (*)    Non Squamous Epithelial 21-50 (*)    All other components within normal limits  CBC WITH DIFFERENTIAL/PLATELET - Abnormal; Notable for the following components:   WBC 11.6 (*)    Neutro Abs 9.4 (*)    Monocytes Absolute 1.4 (*)    All other components within normal limits  BASIC METABOLIC PANEL - Abnormal; Notable for the following components:   Sodium 134 (*)    Glucose, Bld 142 (*)    Creatinine, Ser 1.05 (*)    Calcium 8.7 (*)    GFR calc non Af Amer 60 (*)    All other components within normal limits  URINE CULTURE    EKG None  Radiology No results found.  Procedures Procedures (including critical care time)  Medications Ordered in ED Medications  acetaminophen (TYLENOL) tablet 650 mg (650 mg Oral Given 11/15/18 1845)     Initial Impression / Assessment and Plan / ED Course  I have reviewed the triage vital signs and the nursing notes.  Pertinent labs & imaging results that were available during my care of the patient were reviewed by me and considered in my medical decision making (see chart for details).       53 year old female with left mid to lower back pain.  Symptoms consistent with pyelonephritis.  UA consistent as well.  She was given a dose of Rocephin.  Heart  rate much improved after fluids and antipyretics.  Looks better.  I think she  is appropriate for outpatient treatment.  Continued Keflex.  Urine culture sent.  Final Clinical Impressions(s) / ED Diagnoses   Final diagnoses:  Pyelonephritis    ED Discharge Orders    None       Virgel Manifold, MD 11/16/18 1207

## 2018-11-15 NOTE — Telephone Encounter (Signed)
I called the patient.  She has had a daily headache for a week, she feels somewhat spacey with the headache.  The patient has not done well with the Ubrelvy-does not appear to be improving headaches, she will take Imitrex as well.  I will call in a 3-day course of dexamethasone for the headache.  She will call our office if she is not getting better next week.

## 2018-11-17 DIAGNOSIS — Z20828 Contact with and (suspected) exposure to other viral communicable diseases: Secondary | ICD-10-CM | POA: Diagnosis not present

## 2018-11-18 DIAGNOSIS — E039 Hypothyroidism, unspecified: Secondary | ICD-10-CM | POA: Diagnosis not present

## 2018-11-18 LAB — URINE CULTURE: Culture: >=100000 — AB

## 2018-11-19 ENCOUNTER — Telehealth: Payer: Self-pay

## 2018-12-10 DIAGNOSIS — Z1231 Encounter for screening mammogram for malignant neoplasm of breast: Secondary | ICD-10-CM | POA: Diagnosis not present

## 2018-12-10 DIAGNOSIS — Z6824 Body mass index (BMI) 24.0-24.9, adult: Secondary | ICD-10-CM | POA: Diagnosis not present

## 2018-12-10 DIAGNOSIS — Z01419 Encounter for gynecological examination (general) (routine) without abnormal findings: Secondary | ICD-10-CM | POA: Diagnosis not present

## 2018-12-23 DIAGNOSIS — S61451A Open bite of right hand, initial encounter: Secondary | ICD-10-CM | POA: Diagnosis not present

## 2018-12-23 DIAGNOSIS — S61459A Open bite of unspecified hand, initial encounter: Secondary | ICD-10-CM | POA: Insufficient documentation

## 2018-12-29 ENCOUNTER — Telehealth: Payer: Self-pay

## 2018-12-29 NOTE — Telephone Encounter (Signed)
Received a fax from [facility] for a Renewal PA for Ubrelvy 100MG  tabs.   Started the PA  Key: [id number]  Awaiting response.

## 2018-12-30 NOTE — Telephone Encounter (Signed)
Received an approval for Ubrevly 100 mg. Approved through 12/28/2019. A copy of the approval letter has been faxed to the pharmacy. Confirmation fax has been received.

## 2019-01-08 ENCOUNTER — Other Ambulatory Visit: Payer: Self-pay | Admitting: *Deleted

## 2019-01-08 DIAGNOSIS — G43009 Migraine without aura, not intractable, without status migrainosus: Secondary | ICD-10-CM

## 2019-01-08 MED ORDER — ERENUMAB-AOOE 140 MG/ML ~~LOC~~ SOAJ: 140.0000 mg | SUBCUTANEOUS | 3 refills | Status: DC

## 2019-01-15 DIAGNOSIS — E039 Hypothyroidism, unspecified: Secondary | ICD-10-CM | POA: Diagnosis not present

## 2019-04-21 ENCOUNTER — Telehealth: Payer: Self-pay | Admitting: Family Medicine

## 2019-04-21 DIAGNOSIS — G43009 Migraine without aura, not intractable, without status migrainosus: Secondary | ICD-10-CM

## 2019-04-21 NOTE — Telephone Encounter (Signed)
Pt states she has been told by her [place] [phone] that a pa is needed on both SUMAtriptan (IMITREX) 100 MG tablet and Aimovig, please reply

## 2019-04-23 ENCOUNTER — Telehealth: Payer: Self-pay | Admitting: *Deleted

## 2019-04-23 MED ORDER — SUMATRIPTAN SUCCINATE 100 MG PO TABS: ORAL_TABLET | ORAL | 11 refills | Status: DC

## 2019-04-23 NOTE — Telephone Encounter (Signed)
See previous message.(phone note).

## 2019-04-23 NOTE — Addendum Note (Signed)
Addended by: Brandon Melnick on: 04/23/2019 11:23 AM   Modules accepted: Orders

## 2019-04-23 NOTE — Telephone Encounter (Signed)
I LM for pt to let me know if insurance changed.  The imitrex is now 9 tab/28 days vs 10 tabs.  This may go thru for you.  aimovig may require PA due to new insurance. BIN [id number], [id number], GP [id number].  CMM initiated KEY # for aimovig 140mg  [id number].  Determination pending.    I placed order for imitrex 100mg  tablets #9 tab/ 28 days. (this should go thru w/o PA.

## 2019-04-24 NOTE — Telephone Encounter (Signed)
Received approval for aimovig ([id number] [facility]) 04-23-19 thru 04-22-20.  [place], [phone]. Fax confirmation to [facility] [phone].

## 2019-07-08 ENCOUNTER — Telehealth: Payer: Self-pay | Admitting: *Deleted

## 2019-07-08 NOTE — Telephone Encounter (Signed)
I spoke to pt .  She is continuing to have migraines even with taking aimovig, sumatriptan, and amitriptyline.  Ubrelvy did not  help . She has headaches at night.  Made VV for her tomorrow to discuss.

## 2019-07-09 ENCOUNTER — Encounter: Payer: Self-pay | Admitting: Family Medicine

## 2019-07-09 ENCOUNTER — Telehealth (INDEPENDENT_AMBULATORY_CARE_PROVIDER_SITE_OTHER): Payer: Self-pay | Admitting: Family Medicine

## 2019-07-09 DIAGNOSIS — G43009 Migraine without aura, not intractable, without status migrainosus: Secondary | ICD-10-CM

## 2019-07-09 MED ORDER — EMGALITY 120 MG/ML ~~LOC~~ SOAJ: 120.0000 mg | SUBCUTANEOUS | 3 refills | Status: DC

## 2019-07-09 NOTE — Patient Instructions (Signed)
We will switch Amovig to [employer]. One injection every month. Continue sumatriptan for abortive therapy. Monitor for concerns of sleep apnea as discussed. Stay well hydrated. Well balanced diet and regular exercise advised.   Follow up in 3 months   Sleep Apnea Sleep apnea affects breathing during sleep. It causes breathing to stop for a short time or to become shallow. It can also increase the risk of:  Heart attack.  Stroke.  Being very overweight (obese).  Diabetes.  Heart failure.  Irregular heartbeat. The goal of treatment is to help you breathe normally again. What are the causes? There are three kinds of sleep apnea:  Obstructive sleep apnea. This is caused by a blocked or collapsed airway.  Central sleep apnea. This happens when the brain does not send the right signals to the muscles that control breathing.  Mixed sleep apnea. This is a combination of obstructive and central sleep apnea. The most common cause of this condition is a collapsed or blocked airway. This can happen if:  Your throat muscles are too relaxed.  Your tongue and tonsils are too large.  You are overweight.  Your airway is too small. What increases the risk?  Being overweight.  Smoking.  Having a small airway.  Being older.  Being female.  Drinking alcohol.  Taking medicines to calm yourself (sedatives or tranquilizers).  Having family members with the condition. What are the signs or symptoms?  Trouble staying asleep.  Being sleepy or tired during the day.  Getting angry a lot.  Loud snoring.  Headaches in the morning.  Not being able to focus your mind (concentrate).  Forgetting things.  Less interest in sex.  Mood swings.  Personality changes.  Feelings of sadness (depression).  Waking up a lot during the night to pee (urinate).  Dry mouth.  Sore throat. How is this diagnosed?  Your medical history.  A physical exam.  A test that is done when you  are sleeping (sleep study). The test is most often done in a sleep lab but may also be done at home. How is this treated?   Sleeping on your side.  Using a medicine to get rid of mucus in your nose (decongestant).  Avoiding the use of alcohol, medicines to help you relax, or certain pain medicines (narcotics).  Losing weight, if needed.  Changing your diet.  Not smoking.  Using a machine to open your airway while you sleep, such as: ? An oral appliance. This is a mouthpiece that shifts your lower jaw forward. ? A CPAP device. This device blows air through a mask when you breathe out (exhale). ? An EPAP device. This has valves that you put in each nostril. ? A BPAP device. This device blows air through a mask when you breathe in (inhale) and breathe out.  Having surgery if other treatments do not work. It is important to get treatment for sleep apnea. Without treatment, it can lead to:  High blood pressure.  Coronary artery disease.  In men, not being able to have an erection (impotence).  Reduced thinking ability. Follow these instructions at home: Lifestyle  Make changes that your doctor recommends.  Eat a healthy diet.  Lose weight if needed.  Avoid alcohol, medicines to help you relax, and some pain medicines.  Do not use any products that contain nicotine or tobacco, such as cigarettes, e-cigarettes, and chewing tobacco. If you need help quitting, ask your doctor. General instructions  Take over-the-counter and prescription medicines  only as told by your doctor.  If you were given a machine to use while you sleep, use it only as told by your doctor.  If you are having surgery, make sure to tell your doctor you have sleep apnea. You may need to bring your device with you.  Keep all follow-up visits as told by your doctor. This is important. Contact a doctor if:  The machine that you were given to use during sleep bothers you or does not seem to be  working.  You do not get better.  You get worse. Get help right away if:  Your chest hurts.  You have trouble breathing in enough air.  You have an uncomfortable feeling in your back, arms, or stomach.  You have trouble talking.  One side of your body feels weak.  A part of your face is hanging down. These symptoms may be an emergency. Do not wait to see if the symptoms will go away. Get medical help right away. Call your local emergency services (911 in the [place] Do not drive yourself to the hospital. Summary  This condition affects breathing during sleep.  The most common cause is a collapsed or blocked airway.  The goal of treatment is to help you breathe normally while you sleep. This information is not intended to replace advice given to you by your health care provider. Make sure you discuss any questions you have with your health care provider. Document Revised: 11/08/2017 Document Reviewed: 09/17/2017 Elsevier Patient Education  [place].   Migraine Headache A migraine headache is a very strong throbbing pain on one side or both sides of your head. This type of headache can also cause other symptoms. It can last from 4 hours to 3 days. Talk with your doctor about what things may bring on (trigger) this condition. What are the causes? The exact cause of this condition is not known. This condition may be triggered or caused by:  Drinking alcohol.  Smoking.  Taking medicines, such as: ? Medicine used to treat chest pain (nitroglycerin). ? Birth control pills. ? Estrogen. ? Some blood pressure medicines.  Eating or drinking certain products.  Doing physical activity. Other things that may trigger a migraine headache include:  Having a menstrual period.  Pregnancy.  Hunger.  Stress.  Not getting enough sleep or getting too much sleep.  Weather changes.  Tiredness (fatigue). What increases the risk?  Being 20-59 years old.  Being  female.  Having a family history of migraine headaches.  Being Caucasian.  Having depression or anxiety.  Being very overweight. What are the signs or symptoms?  A throbbing pain. This pain may: ? Happen in any area of the head, such as on one side or both sides. ? Make it hard to do daily activities. ? Get worse with physical activity. ? Get worse around bright lights or loud noises.  Other symptoms may include: ? Feeling sick to your stomach (nauseous). ? Vomiting. ? Dizziness. ? Being sensitive to bright lights, loud noises, or smells.  Before you get a migraine headache, you may get warning signs (an aura). An aura may include: ? Seeing flashing lights or having blind spots. ? Seeing bright spots, halos, or zigzag lines. ? Having tunnel vision or blurred vision. ? Having numbness or a tingling feeling. ? Having trouble talking. ? Having weak muscles.  Some people have symptoms after a migraine headache (postdromal phase), such as: ? Tiredness. ? Trouble thinking (concentrating). How is  this treated?  Taking medicines that: ? Relieve pain. ? Relieve the feeling of being sick to your stomach. ? Prevent migraine headaches.  Treatment may also include: ? Having acupuncture. ? Avoiding foods that bring on migraine headaches. ? Learning ways to control your body functions (biofeedback). ? Therapy to help you know and deal with negative thoughts (cognitive behavioral therapy). Follow these instructions at home: Medicines  Take over-the-counter and prescription medicines only as told by your doctor.  Ask your doctor if the medicine prescribed to you: ? Requires you to avoid driving or using heavy machinery. ? Can cause trouble pooping (constipation). You may need to take these steps to prevent or treat trouble pooping:  Drink enough fluid to keep your pee (urine) pale yellow.  Take over-the-counter or prescription medicines.  Eat foods that are high in fiber.  These include beans, whole grains, and fresh fruits and vegetables.  Limit foods that are high in fat and sugar. These include fried or sweet foods. Lifestyle  Do not drink alcohol.  Do not use any products that contain nicotine or tobacco, such as cigarettes, e-cigarettes, and chewing tobacco. If you need help quitting, ask your doctor.  Get at least 8 hours of sleep every night.  Limit and deal with stress. General instructions      Keep a journal to find out what may bring on your migraine headaches. For example, write down: ? What you eat and drink. ? How much sleep you get. ? Any change in what you eat or drink. ? Any change in your medicines.  If you have a migraine headache: ? Avoid things that make your symptoms worse, such as bright lights. ? It may help to lie down in a dark, quiet room. ? Do not drive or use heavy machinery. ? Ask your doctor what activities are safe for you.  Keep all follow-up visits as told by your doctor. This is important. Contact a doctor if:  You get a migraine headache that is different or worse than others you have had.  You have more than 15 headache days in one month. Get help right away if:  Your migraine headache gets very bad.  Your migraine headache lasts longer than 72 hours.  You have a fever.  You have a stiff neck.  You have trouble seeing.  Your muscles feel weak or like you cannot control them.  You start to lose your balance a lot.  You start to have trouble walking.  You pass out (faint).  You have a seizure. Summary  A migraine headache is a very strong throbbing pain on one side or both sides of your head. These headaches can also cause other symptoms.  This condition may be treated with medicines and changes to your lifestyle.  Keep a journal to find out what may bring on your migraine headaches.  Contact a doctor if you get a migraine headache that is different or worse than others you have  had.  Contact your doctor if you have more than 15 headache days in a month. This information is not intended to replace advice given to you by your health care provider. Make sure you discuss any questions you have with your health care provider. Document Revised: 05/16/2018 Document Reviewed: 03/06/2018 Elsevier Patient Education  2020 [place] injection What is this medicine? GALCANEZUMAB (gal ka NEZ ue mab) is used to prevent migraines and treat cluster headaches. This medicine may be used for other  purposes; ask your health care provider or pharmacist if you have questions. COMMON BRAND NAME(S): Emgality What should I tell my health care provider before I take this medicine? They need to know if you have any of these conditions:  an unusual or allergic reaction to galcanezumab, other medicines, foods, dyes, or preservatives  pregnant or trying to get pregnant  breast-feeding How should I use this medicine? This medicine is for injection under the skin. You will be taught how to prepare and give this medicine. Use exactly as directed. Take your medicine at regular intervals. Do not take your medicine more often than directed. It is important that you put your used needles and syringes in a special sharps container. Do not put them in a trash can. If you do not have a sharps container, call your pharmacist or healthcare provider to get one. Talk to your pediatrician regarding the use of this medicine in children. Special care may be needed. Overdosage: If you think you have taken too much of this medicine contact a poison control center or emergency room at once. NOTE: This medicine is only for you. Do not share this medicine with others. What if I miss a dose? If you miss a dose, take it as soon as you can. If it is almost time for your next dose, take only that dose. Do not take double or extra doses. What may interact with this medicine? Interactions are not  expected. This list may not describe all possible interactions. Give your health care provider a list of all the medicines, herbs, non-prescription drugs, or dietary supplements you use. Also tell them if you smoke, drink alcohol, or use illegal drugs. Some items may interact with your medicine. What should I watch for while using this medicine? Tell your doctor or healthcare professional if your symptoms do not start to get better or if they get worse. What side effects may I notice from receiving this medicine? Side effects that you should report to your doctor or health care professional as soon as possible:  allergic reactions like skin rash, itching or hives, swelling of the face, lips, or tongue Side effects that usually do not require medical attention (report these to your doctor or health care professional if they continue or are bothersome):  pain, redness, or irritation at site where injected This list may not describe all possible side effects. Call your doctor for medical advice about side effects. You may report side effects to FDA at 1-800-FDA-1088. Where should I keep my medicine? Keep out of the reach of children. You will be instructed on how to store this medicine. Throw away any unused medicine after the expiration date on the label. NOTE: This sheet is a summary. It may not cover all possible information. If you have questions about this medicine, talk to your doctor, pharmacist, or health care provider.  2020 Elsevier/[employer] Standard (2017-07-10 12:03:23)

## 2019-07-09 NOTE — Progress Notes (Signed)
PATIENT: Wendy Whitaker DOB: 1965-04-28  REASON FOR VISIT: follow up HISTORY FROM: patient  Virtual Visit via Telephone Note  I connected with Wendy Whitaker on 07/09/19 at  2:00 PM EDT by telephone and verified that I am speaking with the correct person using two identifiers.   I discussed the limitations, risks, security and privacy concerns of performing an evaluation and management service by telephone and the availability of in person appointments. I also discussed with the patient that there may be a patient responsible charge related to this service. The patient expressed understanding and agreed to proceed.   History of Present Illness:  07/09/19 Wendy Whitaker is a 54 y.o. female here today for follow up for migraines. She continues Amovig 140mg  monthly, amitriptyline 50mg  at bedtime for prevention and sumatriptan for abortive therapy. Ubrelvy did not help. Migraines continues fairly regularly. More severe at night. She has been waking up more frequently with headaches. She has at least 10 migraines per month. She is going through a full dose of sumatriptan. She does not snore. She does not wake frequently. She denies dry mouth in the mornings. She wears a mouth guard for TMJ. She sees her dentist regularly. BP are always normal. She denies changes in stress levels. She usually goes to sleep around 10:30-11. Wakes around 7:30. Mom and dad both have sleep apnea.    She has tried and failed; Amovig, topiramate, toprol, Ubrelvy, Cambia, OTC NSAIDs   History (copied from my note on 10/14/2018)  Wendy Whitaker is a 54 y.o. female here today for follow up for migraines.  She has noted improvement in migraine frequency and intensity since increasing Aimovig to 1 her 40 mg monthly.  She continues amitriptyline 50 mg at bedtime.  She reports that sumatriptan does help some with abortive therapy but she typically needs 2 doses of medication.  On average she feels that she is having 4-8 drinks  per month.  They continue to cluster around the same time each month.  She is also noted that weather changes are a trigger.  She denies allergy symptoms.  She is feeling well and able to do all the things she enjoys.   HISTORY: (copied from my note on 07/10/2018)  Wendy Whitaker is a 54 y.o. female here today for follow up of migraines.  She continues preventative therapy with Aimovig 70 mg monthly and amitriptyline 50 mg at bedtime.  She reports initial improvement in her migraines but feels that over the past few months migraines have increased in frequency.  Migraines are similar to those in the past but are occurring more frequently.  She states that she is having to use a 4 months worth of sumatriptan each month.  She does feel that migraines tend to cluster around a certain time of the month.  She is uncertain if this is related to her hormones.  She no longer has a menstrual cycle.  Migraines tend to persist for several days at a time.  She is tolerating medications well without obvious adverse effects.  HISTORY (copied from [facility] note on 02/17/2018)  Wendy Whitaker is a 54 year old female with a history of migraine headaches. She is currently on amitriptyline. She was started on Aimovig. She reports that the first couple months with aimovigshe did not notice much change. She states that then she went 2 months with no migraines. She states that the next month she gaveherself the injection she messed upso she did not receive any medication. She states for  the following month her headaches came back. She is back on Aimovig and so far she is tolerating it well. She continues to use oral sumatriptan with good benefit. She was unable to tolerate nasal Imitrex and Zomig. She returns today for evaluation.  HISTORY07/09/19: Wendy Whitaker is a 54 year old female with a history of migraine headaches. She returns today for follow-up. She states that her headache frequency has worsened.  She has approximately 4-6 headaches a month. She states that she is running out of sumatriptan. She typically always has to take 2 tablets when she gets a headache. Her headache continues to occur across the forehead and sometimes in the neck. She denies photophobia and phonophobia. She does report nausea. In the past she has tried NSAIDs, Cambia, Topamax and toprol.She states that she is also going through menopauseand unsure if this is the cause ofincreased headaches. She returns today for evaluation.  UPDATE (MM)02/12/17 Wendy Whitaker is a 54 year old female with a history of migraine headaches. She returns today for follow-up. She states that she has approximately 1 week as a month that she will have a headache for 3-4 consecutive days. She states that she no longer has a menstrual cycle as she has an IUD placed butshe feels that the headaches come the same week every month. Therefore she feels that there may be a hormonal component to her migraines. She states that the headache is typically located across the forehead. She sometimes will have mild photophobia. She denies nausea vomiting. She states that if she takes Imitrex her headache will resolve but typically does return the next day. She states this she wakes to take Imitrex which on occasion she does he will turn into a debilitating migraine. She continues to take amitriptyline 1-1/2 tablets at bedtime. She returns today for an evaluation.  UPDATE 08/06/16: Having 3-5 days migraine per month.Tolerating amitriptyline 37.5mg  at bedtime. Sumatriptan working for now. Hormones may play a role in her headaches.  UPDATE 02/21/15: Since last visit, HA stable. In last 6 months, had 2 x 3-5 day attacks of migraine. Last attack in the past week. Sumatriptan working. Amitriptyline working.   UPDATE 08/18/14: Since last visit, stable. No headaches over 3 months, then no explanation had 4 migraine in last 1 month. These were  controlled with sumatriptan. Patient satisfied with headache control.  UPDATE 02/17/14: Since last visit, doing much better on amitriptyline. Sleep is better. HA are improved. Has had 1 or 2 months of headache freedom.  UPDATE 11/17/13: Since last visit, continues with 7-8 migraines per month. Sometimes runs out of sumatriptan before end of month. Reluctant to try propranolol due to side effects of exercise intolerance.   UPDATE 05/18/13: Since last visit, HA frequency is back down to 7-8 per month. Has not tried propranolol yet. She is satisifed with current migraine freq and control with sumatriptan.  UPDATE 03/05/13: Since last visit, was doing well until few months ago, when migraines increased up to 14-15 days per month. She thinks she is going through menopause, and has recently tried estrogen patch to see if symptoms and HA improve. So far, it seems to be working. Otherwise, sumatriptan and NSAIDs still help with HA control, but number of days is the main problem (i.e. running out of sumatriptan before end of month).   PRIOR HPI (04/16/12): 54 year old right-handed female, with hypothyroidism, anemia, here for evaluation of migraine headaches. Patient reports history of migraine headaches since her teenage years. She describes global, severe throbbing headaches with mild nausea. No  photophobia or phonophobia. No auras. No vision changes. Triggers include menstrual cycle and insomnia. [facility]. Currently she is well-controlled on combination of topiramate 50 mg at night and sumatriptan 100 mg as needed. Presently she is having 5-6 headache days per month. This is a significant improvement compared to a few years ago. Patient is satisfied with her current headache control. Patient has been on higher doses of topiramate in the past. She's tried Toprol, amitriptyline, as well as a variety of NSAIDs and triptans. Family history notable for  migraine in her son.   Observations/Objective:  Generalized: Well developed, in no acute distress  Mentation: Alert oriented to time, place, history taking. Follows all commands speech and language fluent   Assessment and Plan:  54 y.o. year old female  has a past medical history of Chronic anemia, Hypothyroid, IUD (intrauterine device) in place (07/2013), Melanoma ([place]) (06/2014), and Migraine. here with    ICD-10-CM   1. Migraine without aura and without status migrainosus, not intractable  G43.009    Mrs Diosdado has noted an increase in frequency and intensity of her migraine headaches over the past few months. Migraines occur more frequently in the late evening tonight hours and she reports frequent morning headaches.  No other obvious concerns for sleep apnea with the exception of family history.  She is not been tested for sleep apnea.  I have asked her to think about this for the future.  She does not feel that Aimovig is working as well as it used to.  We will switch to [employer] based on insurance preference.  She was educated on appropriate administration and storage of this medication.  Sumatriptan continues to be beneficial for abortive therapy.  She was encouraged to continue with healthy lifestyle habits including adequate hydration, well-balanced diet and regular exercise.  She will monitor for signs of sleep apnea as discussed and call with any concerns.  I recommend 37-month follow-up.  She verbalizes understanding and agreement with this plan.   No orders of the defined types were placed in this encounter.   Meds ordered this encounter  Medications  . Galcanezumab-gnlm (EMGALITY) 120 MG/ML SOAJ    Sig: Inject 120 mg into the skin every 30 (thirty) days.    Dispense:  3 pen    Refill:  3    Order Specific Question:   Supervising Provider    Answer:   Melvenia Beam [id number]     Follow Up Instructions:  I discussed the assessment and treatment plan with the patient.  The patient was provided an opportunity to ask questions and all were answered. The patient agreed with the plan and demonstrated an understanding of the instructions.   The patient was advised to call back or seek an in-person evaluation if the symptoms worsen or if the condition fails to improve as anticipated.  I provided 20 minutes of non-face-to-face time during this encounter.  Patient is located at her place of residence during my chart visit.  Providers in the office.   Debbora Presto, NP

## 2019-07-09 NOTE — Progress Notes (Signed)
I reviewed note and agree with plan.   Penni Bombard, MD [id number], [id number] PM Certified in Neurology, Neurophysiology and Neuroimaging  [facility] [address], [place], [place] [address] [phone]

## 2019-07-16 ENCOUNTER — Telehealth: Payer: Self-pay | Admitting: *Deleted

## 2019-07-16 ENCOUNTER — Encounter: Payer: Self-pay | Admitting: *Deleted

## 2019-07-16 NOTE — Addendum Note (Signed)
Addended by: Minna Antis on: 07/16/2019 01:49 PM   Modules accepted: Orders

## 2019-07-16 NOTE — Telephone Encounter (Signed)
Emgality PA, EZV:[id number] , G43.009. Failed: Aimovig, Topiramate, toprol, Ubrelvy, Cambia, NSAIDS.  [facility] has received your information, and the request will be reviewed.  You will receive an electronic determination in CoverMyMeds. You can see the latest determination by locating this request on your dashboard or by reopening this request. You will also receive a faxed copy of the determination. If you have any questions please contact [facility] at [phone].

## 2019-07-16 NOTE — Telephone Encounter (Signed)
Emgality Approved today, PA Case: [id number], Status: Approved, Coverage Starts on: 07/16/2019 12:00:00 AM, Coverage Ends on: 01/12/2020. Approval faxed to pharmacy, patient notified via my chart.

## 2019-09-02 ENCOUNTER — Other Ambulatory Visit: Payer: Self-pay | Admitting: *Deleted

## 2019-09-02 DIAGNOSIS — G43009 Migraine without aura, not intractable, without status migrainosus: Secondary | ICD-10-CM

## 2019-09-02 MED ORDER — AMITRIPTYLINE HCL 25 MG PO TABS: 25.0000 mg | ORAL_TABLET | Freq: Every day | ORAL | 3 refills | Status: DC

## 2020-04-06 ENCOUNTER — Telehealth: Payer: Self-pay | Admitting: *Deleted

## 2020-04-06 NOTE — Telephone Encounter (Signed)
Emgality PA, key: [id number], G43.009. failed: aimovig, topiramate, toprol, Ubrelvy, Cambia, NSAIDS. Your information has been sent to [place].

## 2020-04-07 ENCOUNTER — Encounter: Payer: Self-pay | Admitting: *Deleted

## 2020-04-07 NOTE — Telephone Encounter (Addendum)
Approved Emgality today.. The authorization is effective from 04/30/2020 to 09/29/2020, as long as the member is enrolled in their current health plan.  This request has been approved for EMGALITY 120 MG/ML PEN with a quantity limit of 1 mL per 30 days.Authorization number [id number] has also been approved for EMGALITY 120 MG/ML PEN with a quantity limit of 2 mL for 30 days and is valid from 04/07/2020 to 05/07/2020 with 1 fill.  Sent her my chart to advise.

## 2020-07-05 ENCOUNTER — Encounter: Payer: Self-pay | Admitting: Family Medicine

## 2020-07-06 ENCOUNTER — Other Ambulatory Visit: Payer: Self-pay | Admitting: *Deleted

## 2020-07-06 ENCOUNTER — Telehealth: Payer: Self-pay | Admitting: Family Medicine

## 2020-07-06 DIAGNOSIS — G43009 Migraine without aura, not intractable, without status migrainosus: Secondary | ICD-10-CM

## 2020-07-06 MED ORDER — EMGALITY 120 MG/ML ~~LOC~~ SOAJ: 120.0000 mg | SUBCUTANEOUS | 0 refills | Status: DC

## 2020-07-06 MED ORDER — SUMATRIPTAN SUCCINATE 100 MG PO TABS: ORAL_TABLET | ORAL | 2 refills | Status: DC

## 2020-07-06 NOTE — Telephone Encounter (Signed)
..   Pt understands that although there may be some limitations with this type of visit, we will take all precautions to reduce any security or privacy concerns.  Pt understands that this will be treated like an in office visit and we will file with pt's insurance, and there may be a patient responsible charge related to this service. ? ?

## 2020-07-06 NOTE — Telephone Encounter (Signed)
Noted migraines.

## 2020-09-05 ENCOUNTER — Other Ambulatory Visit: Payer: Self-pay

## 2020-09-05 DIAGNOSIS — G43009 Migraine without aura, not intractable, without status migrainosus: Secondary | ICD-10-CM

## 2020-09-05 MED ORDER — AMITRIPTYLINE HCL 25 MG PO TABS: 25.0000 mg | ORAL_TABLET | Freq: Every day | ORAL | 0 refills | Status: DC

## 2020-09-20 ENCOUNTER — Telehealth (INDEPENDENT_AMBULATORY_CARE_PROVIDER_SITE_OTHER): Payer: 59 | Admitting: Family Medicine

## 2020-09-20 ENCOUNTER — Encounter: Payer: Self-pay | Admitting: Family Medicine

## 2020-09-20 DIAGNOSIS — G43009 Migraine without aura, not intractable, without status migrainosus: Secondary | ICD-10-CM

## 2020-09-20 MED ORDER — AMITRIPTYLINE HCL 25 MG PO TABS: 25.0000 mg | ORAL_TABLET | Freq: Every day | ORAL | 3 refills | Status: DC

## 2020-09-20 MED ORDER — SUMATRIPTAN SUCCINATE 100 MG PO TABS: ORAL_TABLET | ORAL | 11 refills | Status: DC

## 2020-09-20 MED ORDER — EMGALITY 120 MG/ML ~~LOC~~ SOAJ: 120.0000 mg | SUBCUTANEOUS | 3 refills | Status: DC

## 2020-09-20 NOTE — Patient Instructions (Signed)
Below is our plan:  We will continue Emgality, amitriptyline and sumatriptan as prescribed.   Please make sure you are staying well hydrated. I recommend 50-60 ounces daily. Well balanced diet and regular exercise encouraged. Consistent sleep schedule with 6-8 hours recommended.   Please continue follow up with care team as directed.   Follow up with me in 1 year   You may receive a survey regarding today's visit. I encourage you to leave honest feed back as I do use this information to improve patient care. Thank you for seeing me today!

## 2020-09-20 NOTE — Progress Notes (Signed)
PATIENT: Wendy Whitaker DOB: 1965/07/30  REASON FOR VISIT: follow up HISTORY FROM: patient  Virtual Visit via Telephone Note  I connected with Wendy Whitaker on 09/20/20 at  2:30 PM EDT by telephone and verified that I am speaking with the correct person using two identifiers.   I discussed the limitations, risks, security and privacy concerns of performing an evaluation and management service by telephone and the availability of in person appointments. I also discussed with the patient that there may be a patient responsible charge related to this service. The patient expressed understanding and agreed to proceed.   History of Present Illness:  09/20/20 ALL: Wendy Whitaker continues to do very well. We swicthed Amovig to Wendy Whitaker and she reports headaches are very well managed. She continues amitriptyline '50mg'$  at bedtime and sumatriptan as needed. She may use 9 tablets over 3-4 months. She feels that morning headaches are better (unsure if related to switch to [facility]). PCP has been watching her closely for prediabetes. She is doing well, today and without concerns.   07/09/2019 ALL:  Wendy Whitaker is a 55 y.o. female here today for follow up for migraines. She continues Amovig '140mg'$  monthly, amitriptyline '50mg'$  at bedtime for prevention and sumatriptan for abortive therapy. Ubrelvy did not help. Migraines continues fairly regularly. More severe at night. She has been waking up more frequently with headaches. She has at least 10 migraines per month. She is going through a full dose of sumatriptan. She does not snore. She does not wake frequently. She denies dry mouth in the mornings. She wears a mouth guard for TMJ. She sees her dentist regularly. BP are always normal. She denies changes in stress levels. She usually goes to sleep around 10:30-11. Wakes around 7:30. Mom and dad both have sleep apnea.    Observations/Objective:  Generalized: Well developed, in no acute distress  Mentation: Alert  oriented to time, place, history taking. Follows all commands speech and language fluent   Assessment and Plan:  55 y.o. year old female  has a past medical history of Chronic anemia, Hypothyroid, IUD (intrauterine device) in place (07/2013), Melanoma ([place]) (06/2014), and Migraine. here with    ICD-10-CM   1. Migraine without aura and without status migrainosus, not intractable  G43.009 amitriptyline (ELAVIL) 25 MG tablet    SUMAtriptan (IMITREX) 100 MG tablet      Wendy Whitaker is doing well. We will continue Emgality every 30 days and amitriptyline '25mg'$  at bedtime. Sumatriptan continues to be beneficial for abortive therapy.  She was encouraged to continue with healthy lifestyle habits including adequate hydration, well-balanced diet and regular exercise. I recommend 42-monthfollow-up.  She verbalizes understanding and agreement with this plan.   No orders of the defined types were placed in this encounter.   Meds ordered this encounter  Medications   amitriptyline (ELAVIL) 25 MG tablet    Sig: Take 1-2 tablets (25-50 mg total) by mouth at bedtime. Please keep upcoming appt for further refills    Dispense:  180 tablet    Refill:  3    Order Specific Question:   Supervising Provider    Answer:   AMelvenia Beam[[id number]  Galcanezumab-gnlm (EMGALITY) 120 MG/ML SOAJ    Sig: Inject 120 mg into the skin every 30 (thirty) days.    Dispense:  3 mL    Refill:  3    Order Specific Question:   Supervising Provider    Answer:   AMelvenia Beam[[id number]  SUMAtriptan (  IMITREX) 100 MG tablet    Sig: Take 1 tab at the onset of migraine can repeat in 2 hours if needed. Not to exceed 2 tabs/24 hours.    Dispense:  9 tablet    Refill:  11    Must last 28 days    Order Specific Question:   Supervising Provider    Answer:   Melvenia Beam [id number]      Follow Up Instructions:  I discussed the assessment and treatment plan with the patient. The patient was provided an opportunity to  ask questions and all were answered. The patient agreed with the plan and demonstrated an understanding of the instructions.   The patient was advised to call back or seek an in-person evaluation if the symptoms worsen or if the condition fails to improve as anticipated.  I provided 20 minutes of non-face-to-face time during this encounter.  Patient is located at her place of residence during my chart visit.  Providers in the office.   Debbora Presto, NP

## 2020-10-21 ENCOUNTER — Telehealth: Payer: Self-pay | Admitting: Neurology

## 2020-10-21 NOTE — Telephone Encounter (Signed)
PA completed on CMM/medimpact KEY: [id number] Will await response

## 2020-10-24 NOTE — Telephone Encounter (Signed)
PA approved. The authorization is effective from 10/21/2020 to 10/20/2021, as long as the member is enrolled in their current health plan. The request was reviewed and approved by a licensed clinical pharmacist. The request has been approved for a quantity limit of 1mL per 30 days.

## 2020-10-27 DIAGNOSIS — M25552 Pain in left hip: Secondary | ICD-10-CM | POA: Insufficient documentation

## 2020-10-27 DIAGNOSIS — M24159 Other articular cartilage disorders, unspecified hip: Secondary | ICD-10-CM | POA: Insufficient documentation

## 2021-02-09 DIAGNOSIS — M2391 Unspecified internal derangement of right knee: Secondary | ICD-10-CM | POA: Insufficient documentation

## 2021-02-14 ENCOUNTER — Telehealth: Payer: Self-pay | Admitting: *Deleted

## 2021-02-14 NOTE — Telephone Encounter (Signed)
Submitted PA Emgality on CMM. Key: [id number]. Waiting on determination from [place] [employer].

## 2021-02-23 NOTE — Telephone Encounter (Signed)
Called pt. She confirmed she changed insurance. Has temp insurance for this month and then will have [employer] starting in February. She decided not to take Emgality this month. I offered for her to come pick up sample to have her covered until [employer] starts but patient declined. She wants to do more research w/ [employer] insurance/copay card for [facility] before proceeding. She has no yearly appt scheduled either. I offered to make this but she declined. She will call back to update [place] once she finds out more information.

## 2021-03-08 NOTE — Telephone Encounter (Signed)
Received a new prior auth request for the pt forEmgality through [facility]. Completed on CMM/[employer] KEY: [id number]

## 2021-07-19 ENCOUNTER — Telehealth: Payer: Self-pay | Admitting: Family Medicine

## 2021-07-19 ENCOUNTER — Encounter: Payer: Self-pay | Admitting: Neurology

## 2021-07-19 DIAGNOSIS — G43009 Migraine without aura, not intractable, without status migrainosus: Secondary | ICD-10-CM

## 2021-07-19 MED ORDER — SUMATRIPTAN SUCCINATE 100 MG PO TABS: ORAL_TABLET | ORAL | 0 refills | Status: DC

## 2021-07-19 NOTE — Telephone Encounter (Signed)
Refill sent for the patient. Pt doesn't have a upcoming apt and will be due in aug. Sent pt a Pharmacist, community message informing of this

## 2021-07-19 NOTE — Telephone Encounter (Signed)
[facility]) request 90-day supply, 3 refills for SUMAtriptan (IMITREX) 100 MG tablet. At   [facility] [address] [place] [address]  Phone: [phone]  Fax: [phone]

## 2021-09-11 ENCOUNTER — Ambulatory Visit: Payer: Commercial Managed Care - HMO | Admitting: Family Medicine

## 2021-09-11 ENCOUNTER — Encounter: Payer: Self-pay | Admitting: Family Medicine

## 2021-09-11 VITALS — BP 109/73 | HR 76 | Ht 63.0 in | Wt 139.5 lb

## 2021-09-11 DIAGNOSIS — G43009 Migraine without aura, not intractable, without status migrainosus: Secondary | ICD-10-CM

## 2021-09-11 DIAGNOSIS — L709 Acne, unspecified: Secondary | ICD-10-CM | POA: Insufficient documentation

## 2021-09-11 DIAGNOSIS — E039 Hypothyroidism, unspecified: Secondary | ICD-10-CM | POA: Insufficient documentation

## 2021-09-11 DIAGNOSIS — R7301 Impaired fasting glucose: Secondary | ICD-10-CM | POA: Insufficient documentation

## 2021-09-11 DIAGNOSIS — E7800 Pure hypercholesterolemia, unspecified: Secondary | ICD-10-CM | POA: Insufficient documentation

## 2021-09-11 DIAGNOSIS — E785 Hyperlipidemia, unspecified: Secondary | ICD-10-CM | POA: Insufficient documentation

## 2021-09-11 MED ORDER — SUMATRIPTAN SUCCINATE 100 MG PO TABS: ORAL_TABLET | ORAL | 0 refills | Status: DC

## 2021-09-11 MED ORDER — AMITRIPTYLINE HCL 25 MG PO TABS: 25.0000 mg | ORAL_TABLET | Freq: Every day | ORAL | 3 refills | Status: DC

## 2021-09-11 NOTE — Patient Instructions (Signed)
Below is our plan:  We will continue amitriptyline 25-'50mg'$  daily at bedtime. Continue sumatriptan as needed.   Please make sure you are staying well hydrated. I recommend 50-60 ounces daily. Well balanced diet and regular exercise encouraged. Consistent sleep schedule with 6-8 hours recommended.   Please continue follow up with care team as directed.   Follow up with me in 1 year   You may receive a survey regarding today's visit. I encourage you to leave honest feed back as I do use this information to improve patient care. Thank you for seeing me today!

## 2021-09-11 NOTE — Progress Notes (Signed)
Chief Complaint  Patient presents with   Follow-up    Rm 13. Alone. Pt reports 1-2 migraine days a month.    HISTORY OF PRESENT ILLNESS:  09/11/21 ALL:  Wendy Whitaker is a 56 y.o. female here today for follow up for migraines. She continues amitriptyline '50mg'$  QHS and sumatriptan '100mg'$  PRN. She discontinued Emgality about 6-8 months ago due to cost. She feels that migraines are well managed. Maybe 1-2 per month. She was recently diagnosed with prediabetes. She is tolerating metformin well. Last A1C 5.8. She is watching her diet and exercising regularly.   She does note fairly constant itching of bilateral upper ext. Symptoms have been present for several years. Dermatology feels it is related to dry skin/eczema. She is unsure. She denies dry mouth or constipation.   09/20/20 ALL (Mychart): Wendy Whitaker continues to do very well. We swicthed Amovig to [facility] and she reports headaches are very well managed. She continues amitriptyline '50mg'$  at bedtime and sumatriptan as needed. She may use 9 tablets over 3-4 months. She feels that morning headaches are better (unsure if related to switch to [facility]). PCP has been watching her closely for prediabetes. She is doing well, today and without concerns.    07/09/2019 ALL (Mychart):  Wendy Whitaker is a 56 y.o. female here today for follow up for migraines. She continues Amovig '140mg'$  monthly, amitriptyline '50mg'$  at bedtime for prevention and sumatriptan for abortive therapy. Ubrelvy did not help. Migraines continues fairly regularly. More severe at night. She has been waking up more frequently with headaches. She has at least 10 migraines per month. She is going through a full dose of sumatriptan. She does not snore. She does not wake frequently. She denies dry mouth in the mornings. She wears a mouth guard for TMJ. She sees her dentist regularly. BP are always normal. She denies changes in stress levels. She usually goes to sleep around 10:30-11. Wakes  around 7:30. Mom and dad both have sleep apnea.    REVIEW OF SYSTEMS: Out of a complete 14 system review of symptoms, the patient complains only of the following symptoms, headaches, itchy skin and all other reviewed systems are negative.   ALLERGIES: No Known Allergies   HOME MEDICATIONS: Outpatient Medications Prior to Visit  Medication Sig Dispense Refill   levothyroxine (SYNTHROID, LEVOTHROID) 50 MCG tablet Take 88 mcg by mouth at bedtime.     metFORMIN (GLUCOPHAGE-XR) 500 MG 24 hr tablet Take 500 mg by mouth daily.     amitriptyline (ELAVIL) 25 MG tablet Take 1-2 tablets (25-50 mg total) by mouth at bedtime. Please keep upcoming appt for further refills 180 tablet 3   SUMAtriptan (IMITREX) 100 MG tablet Take 1 tab at the onset of migraine can repeat in 2 hours if needed. Not to exceed 2 tabs/24 hours. 27 tablet 0   Galcanezumab-gnlm (EMGALITY) 120 MG/ML SOAJ Inject 120 mg into the skin every 30 (thirty) days. 3 mL 3   No facility-administered medications prior to visit.     PAST MEDICAL HISTORY: Past Medical History:  Diagnosis Date   Chronic anemia    Hypothyroid    IUD (intrauterine device) in place 07/2013   placed to control menses and related HA   Melanoma ([place]) 06/2014   back   Migraine    07/2014 4 migraines     PAST SURGICAL HISTORY: Past Surgical History:  Procedure Laterality Date   CESAREAN SECTION  1988   HAND SURGERY     x2  KNEE SURGERY Right    multiple x's   ROTATOR CUFF REPAIR Right 12/03/2017   SKIN SURGERY  06/2014   melanoma on back removed in dr office   steroid inj in L elbow       FAMILY HISTORY: Family History  Problem Relation Age of Onset   Migraines Son      SOCIAL HISTORY: Social History   Socioeconomic History   Marital status: Single    Spouse name: Jonni Sanger   Number of children: 3   Years of education: Social worker   Highest education level: Not on file  Occupational History    Employer: OTHER    Comment: n/a  Tobacco  Use   Smoking status: Never   Smokeless tobacco: Never  Substance and Sexual Activity   Alcohol use: Yes    Comment: 1-2 alcohol drinks on the weekends   Drug use: Not on file   Sexual activity: Not on file  Other Topics Concern   Not on file  Social History Narrative   Patient lives at home with her spouse.   Caffeine Use: 1 cup 2-3 times weekly   Social Determinants of Health   Financial Resource Strain: Not on file  Food Insecurity: Not on file  Transportation Needs: Not on file  Physical Activity: Not on file  Stress: Not on file  Social Connections: Not on file  Intimate Partner Violence: Not on file     PHYSICAL EXAM  Vitals:   09/11/21 1415  BP: 109/73  Pulse: 76  Weight: 139 lb 8 oz (63.3 kg)  Height: '5\' 3"'$  (1.6 m)   Body mass index is 24.71 kg/m.  Generalized: Well developed, in no acute distress  Cardiology: normal rate and rhythm, no murmur auscultated  Respiratory: clear to auscultation bilaterally    Neurological examination  Mentation: Alert oriented to time, place, history taking. Follows all commands speech and language fluent Cranial nerve II-XII: Pupils were equal round reactive to light. Extraocular movements were full, visual field were full on confrontational test. Facial sensation and strength were normal. Head turning and shoulder shrug  were normal and symmetric. Motor: The motor testing reveals 5 over 5 strength of all 4 extremities. Good symmetric motor tone is noted throughout.  Gait and station: Gait is normal.    DIAGNOSTIC DATA (LABS, IMAGING, TESTING) - I reviewed patient records, labs, notes, testing and imaging myself where available.  Lab Results  Component Value Date   WBC 11.6 (H) 11/15/2018   HGB 12.0 11/15/2018   HCT 37.4 11/15/2018   MCV 85.8 11/15/2018   PLT 226 11/15/2018      Component Value Date/Time   NA 134 (L) 11/15/2018 1906   K 3.6 11/15/2018 1906   CL 99 11/15/2018 1906   CO2 23 11/15/2018 1906    GLUCOSE 142 (H) 11/15/2018 1906   BUN 19 11/15/2018 1906   CREATININE 1.05 (H) 11/15/2018 1906   CALCIUM 8.7 (L) 11/15/2018 1906   GFRNONAA 60 (L) 11/15/2018 1906   GFRAA >60 11/15/2018 1906   No results found for: "CHOL", "HDL", "LDLCALC", "LDLDIRECT", "TRIG", "CHOLHDL" No results found for: "HGBA1C" No results found for: "VITAMINB12" No results found for: "TSH"      No data to display               No data to display           ASSESSMENT AND PLAN  56 y.o. year old female  has a past medical history of  Chronic anemia, Hypothyroid, IUD (intrauterine device) in place (07/2013), Melanoma ([place]) (06/2014), and Migraine. here with    Migraine without aura and without status migrainosus, not intractable - Plan: amitriptyline (ELAVIL) 25 MG tablet, SUMAtriptan (IMITREX) 100 MG tablet   Wendy Whitaker is doing very well. Migraines remain well managed despite discontinuation of Emgality about 8 months ago. We have discussed possible side effects of amitriptyline. She will continue 25-'50mg'$  QHS for now but may consider weaning this in the future. She will continue sumatriptan as needed. Healthy lifestyle habits encouraged. She will follow up with PCP as directed. She will return to see me in 1 year, sooner if needed. She verbalizes understanding and agreement with this plan.    No orders of the defined types were placed in this encounter.    Meds ordered this encounter  Medications   amitriptyline (ELAVIL) 25 MG tablet    Sig: Take 1-2 tablets (25-50 mg total) by mouth at bedtime. Please keep upcoming appt for further refills    Dispense:  180 tablet    Refill:  3    Order Specific Question:   Supervising Provider    Answer:   Melvenia Beam [[id number]]   SUMAtriptan (IMITREX) 100 MG tablet    Sig: Take 1 tab at the onset of migraine can repeat in 2 hours if needed. Not to exceed 2 tabs/24 hours.    Dispense:  27 tablet    Refill:  0    Must last 90 days    Order Specific  Question:   Supervising Provider    Answer:   Melvenia Beam [[id number]]     Debbora Presto, MSN, FNP-C 09/11/2021, 3:47 PM  [facility] [address], [place], [place] [address] [phone]

## 2021-10-10 ENCOUNTER — Other Ambulatory Visit: Payer: Self-pay

## 2021-10-10 DIAGNOSIS — G43009 Migraine without aura, not intractable, without status migrainosus: Secondary | ICD-10-CM

## 2021-10-10 MED ORDER — AMITRIPTYLINE HCL 25 MG PO TABS: 25.0000 mg | ORAL_TABLET | Freq: Every day | ORAL | 3 refills | Status: DC

## 2022-03-28 ENCOUNTER — Other Ambulatory Visit: Payer: Self-pay | Admitting: Family Medicine

## 2022-03-28 DIAGNOSIS — E2839 Other primary ovarian failure: Secondary | ICD-10-CM

## 2022-04-02 ENCOUNTER — Other Ambulatory Visit: Payer: Self-pay | Admitting: Obstetrics and Gynecology

## 2022-04-02 DIAGNOSIS — Z1231 Encounter for screening mammogram for malignant neoplasm of breast: Secondary | ICD-10-CM

## 2022-04-17 ENCOUNTER — Other Ambulatory Visit: Payer: Self-pay | Admitting: *Deleted

## 2022-04-17 DIAGNOSIS — G43009 Migraine without aura, not intractable, without status migrainosus: Secondary | ICD-10-CM

## 2022-04-17 MED ORDER — SUMATRIPTAN SUCCINATE 100 MG PO TABS: ORAL_TABLET | ORAL | 1 refills | Status: DC

## 2022-05-07 ENCOUNTER — Other Ambulatory Visit: Payer: BC Managed Care – PPO

## 2022-09-18 ENCOUNTER — Telehealth: Payer: Commercial Managed Care - HMO | Admitting: Family Medicine

## 2022-09-19 ENCOUNTER — Other Ambulatory Visit: Payer: Self-pay

## 2022-09-19 DIAGNOSIS — G43009 Migraine without aura, not intractable, without status migrainosus: Secondary | ICD-10-CM

## 2022-09-19 MED ORDER — SUMATRIPTAN SUCCINATE 100 MG PO TABS: ORAL_TABLET | ORAL | 2 refills | Status: DC

## 2022-10-15 ENCOUNTER — Other Ambulatory Visit: Payer: BC Managed Care – PPO

## 2022-10-15 ENCOUNTER — Ambulatory Visit: Payer: BC Managed Care – PPO

## 2022-11-01 ENCOUNTER — Other Ambulatory Visit: Payer: Self-pay

## 2022-11-01 DIAGNOSIS — G43009 Migraine without aura, not intractable, without status migrainosus: Secondary | ICD-10-CM

## 2022-11-01 MED ORDER — AMITRIPTYLINE HCL 25 MG PO TABS: 25.0000 mg | ORAL_TABLET | Freq: Every day | ORAL | 0 refills | Status: DC

## 2022-11-06 ENCOUNTER — Other Ambulatory Visit: Payer: Self-pay

## 2022-11-13 ENCOUNTER — Other Ambulatory Visit: Payer: Self-pay | Admitting: *Deleted

## 2022-11-13 DIAGNOSIS — G43009 Migraine without aura, not intractable, without status migrainosus: Secondary | ICD-10-CM

## 2022-11-13 MED ORDER — AMITRIPTYLINE HCL 25 MG PO TABS: 25.0000 mg | ORAL_TABLET | Freq: Every day | ORAL | 0 refills | Status: DC

## 2022-11-13 NOTE — Telephone Encounter (Signed)
Last seen on 09/11/21 Follow up scheduled on 11/18/21

## 2022-11-14 NOTE — Progress Notes (Deleted)
PATIENT: Wendy Whitaker DOB: 06/11/1965  REASON FOR VISIT: follow up HISTORY FROM: patient  Virtual Visit via Telephone Note  I connected with Wendy Whitaker on 11/14/22 at  9:45 AM EDT by telephone and verified that I am speaking with the correct person using two identifiers.   I discussed the limitations, risks, security and privacy concerns of performing an evaluation and management service by telephone and the availability of in person appointments. I also discussed with the patient that there may be a patient responsible charge related to this service. The patient expressed understanding and agreed to proceed.   History of Present Illness:  11/14/22 ALL (Mychart): Wendy Whitaker returns for follow up for migraines. She continues amitriptyline 50mg  QHS and sumatriptan 100mg  PRN.  09/11/21 ALL:  Wendy Whitaker is a 57 y.o. female here today for follow up for migraines. She continues amitriptyline 50mg  QHS and sumatriptan 100mg  PRN. She discontinued Emgality about 6-8 months ago due to cost. She feels that migraines are well managed. Maybe 1-2 per month. She was recently diagnosed with prediabetes. She is tolerating metformin well. Last A1C 5.8. She is watching her diet and exercising regularly.   She does note fairly constant itching of bilateral upper ext. Symptoms have been present for several years. Dermatology feels it is related to dry skin/eczema. She is unsure. She denies dry mouth or constipation.   09/20/2020 (Mychart): Wendy Whitaker continues to do very well. We swicthed Amovig to [facility] and she reports headaches are very well managed. She continues amitriptyline 50mg  at bedtime and sumatriptan as needed. She may use 9 tablets over 3-4 months. She feels that morning headaches are better (unsure if related to switch to [facility]). PCP has been watching her closely for prediabetes. She is doing well, today and without concerns.   07/09/2019 ALL:  Wendy Whitaker is a 57 y.o. female here today  for follow up for migraines. She continues Amovig 140mg  monthly, amitriptyline 50mg  at bedtime for prevention and sumatriptan for abortive therapy. Ubrelvy did not help. Migraines continues fairly regularly. More severe at night. She has been waking up more frequently with headaches. She has at least 10 migraines per month. She is going through a full dose of sumatriptan. She does not snore. She does not wake frequently. She denies dry mouth in the mornings. She wears a mouth guard for TMJ. She sees her dentist regularly. BP are always normal. She denies changes in stress levels. She usually goes to sleep around 10:30-11. Wakes around 7:30. Mom and dad both have sleep apnea.    Observations/Objective:  Generalized: Well developed, in no acute distress  Mentation: Alert oriented to time, place, history taking. Follows all commands speech and language fluent   Assessment and Plan:  57 y.o. year old female  has a past medical history of Chronic anemia, Hypothyroid, IUD (intrauterine device) in place (07/2013), Melanoma ([facility]) (06/2014), and Migraine. here with  No diagnosis found.   Mrs Slagel is doing well. We will continue Emgality every 30 days and amitriptyline 25mg  at bedtime. Sumatriptan continues to be beneficial for abortive therapy.  She was encouraged to continue with healthy lifestyle habits including adequate hydration, well-balanced diet and regular exercise. I recommend 14-month follow-up.  She verbalizes understanding and agreement with this plan.   No orders of the defined types were placed in this encounter.    No orders of the defined types were placed in this encounter.     Follow Up Instructions:  I discussed the assessment and treatment plan  with the patient. The patient was provided an opportunity to ask questions and all were answered. The patient agreed with the plan and demonstrated an understanding of the instructions.   The patient was advised to call back or seek  an in-person evaluation if the symptoms worsen or if the condition fails to improve as anticipated.  I provided 20 minutes of non-face-to-face time during this encounter.  Patient is located at her place of residence during my chart visit.  Providers in the office.   Shawnie Dapper, NP

## 2022-11-19 ENCOUNTER — Telehealth: Payer: Managed Care, Other (non HMO) | Admitting: Family Medicine

## 2022-11-19 DIAGNOSIS — G43009 Migraine without aura, not intractable, without status migrainosus: Secondary | ICD-10-CM

## 2023-06-06 ENCOUNTER — Ambulatory Visit: Admitting: Family Medicine

## 2023-06-06 ENCOUNTER — Telehealth: Payer: Self-pay | Admitting: Family Medicine

## 2023-06-06 ENCOUNTER — Other Ambulatory Visit: Payer: Self-pay | Admitting: Family Medicine

## 2023-06-06 ENCOUNTER — Encounter: Payer: Self-pay | Admitting: Family Medicine

## 2023-06-06 VITALS — BP 111/72 | HR 63 | Ht 63.0 in | Wt 142.0 lb

## 2023-06-06 DIAGNOSIS — G43009 Migraine without aura, not intractable, without status migrainosus: Secondary | ICD-10-CM

## 2023-06-06 MED ORDER — SUMATRIPTAN SUCCINATE 100 MG PO TABS: ORAL_TABLET | ORAL | 2 refills | Status: DC

## 2023-06-06 MED ORDER — AMITRIPTYLINE HCL 10 MG PO TABS
20.0000 mg | ORAL_TABLET | Freq: Every day | ORAL | 3 refills | Status: AC
  Filled 2024-03-09: qty 180, 90d supply, fill #0

## 2023-06-06 NOTE — Telephone Encounter (Signed)
F/u appointment made

## 2023-06-06 NOTE — Patient Instructions (Addendum)
 Below is our plan:  We will reduce amitriptyline  to 20mg  daily. Monitor morning headaches. If not improving, consider sleep eval.   Please make sure you are staying well hydrated. I recommend 50-60 ounces daily. Well balanced diet and regular exercise encouraged. Consistent sleep schedule with 6-8 hours recommended.   Please continue follow up with care team as directed.   Follow up with me in 1 year, may follow up as needed if PCP willing to fill meds.  You may receive a survey regarding today's visit. I encourage you to leave honest feed back as I do use this information to improve patient care. Thank you for seeing me today!   GENERAL HEADACHE INFORMATION:   Natural supplements: Magnesium Oxide or Magnesium Glycinate 500 mg at bed (up to 800 mg daily) Coenzyme Q10 300 mg in AM Vitamin B2- 200 mg twice a day   Add 1 supplement at a time since even natural supplements can have undesirable side effects. You can sometimes buy supplements cheaper (especially Coenzyme Q10) at www.WebmailGuide.co.za or at [facility].  Migraine with aura: There is increased risk for stroke in women with migraine with aura and a contraindication for the combined contraceptive pill for use by women who have migraine with aura. The risk for women with migraine without aura is lower. However other risk factors like smoking are far more likely to increase stroke risk than migraine. There is a recommendation for no smoking and for the use of OCPs without estrogen such as progestogen only pills particularly for women with migraine with aura.Aaron Aas People who have migraine headaches with auras may be 3 times more likely to have a stroke caused by a blood clot, compared to migraine patients who don't see auras. Women who take hormone-replacement therapy may be 30 percent more likely to suffer a clot-based stroke than women not taking medication containing estrogen. Other risk factors like smoking and high blood pressure may be  much more  important.    Vitamins and herbs that show potential:   Magnesium: Magnesium (250 mg twice a day or 500 mg at bed) has a relaxant effect on smooth muscles such as blood vessels. Individuals suffering from frequent or daily headache usually have low magnesium levels which can be increase with daily supplementation of 400-750 mg. Three trials found 40-90% average headache reduction  when used as a preventative. Magnesium may help with headaches are aura, the best evidence for magnesium is for migraine with aura is its thought to stop the cortical spreading depression we believe is the pathophysiology of migraine aura.Magnesium also demonstrated the benefit in menstrually related migraine.  Magnesium is part of the messenger system in the serotonin cascade and it is a good muscle relaxant.  It is also useful for constipation which can be a side effect of other medications used to treat migraine. Good sources include nuts, whole grains, and tomatoes. Side Effects: loose stool/diarrhea  Riboflavin (vitamin B 2) 200 mg twice a day. This vitamin assists nerve cells in the production of ATP a principal energy storing molecule.  It is necessary for many chemical reactions in the body.  There have been at least 3 clinical trials of riboflavin using 400 mg per day all of which suggested that migraine frequency can be decreased.  All 3 trials showed significant improvement in over half of migraine sufferers.  The supplement is found in bread, cereal, milk, meat, and poultry.  Most Americans get more riboflavin than the recommended daily allowance, however riboflavin deficiency is  not necessary for the supplements to help prevent headache. Side effects: energizing, green urine   Coenzyme Q10: This is present in almost all cells in the body and is critical component for the conversion of energy.  Recent studies have shown that a nutritional supplement of CoQ10 can reduce the frequency of migraine attacks by improving the  energy production of cells as with riboflavin.  Doses of 150 mg twice a day have been shown to be effective.   Melatonin: Increasing evidence shows correlation between melatonin secretion and headache conditions.  Melatonin supplementation has decreased headache intensity and duration.  It is widely used as a sleep aid.  Sleep is natures way of dealing with migraine.  A dose of 3 mg is recommended to start for headaches including cluster headache. Higher doses up to 15 mg has been reviewed for use in Cluster headache and have been used. The rationale behind using melatonin for cluster is that many theories regarding the cause of Cluster headache center around the disruption of the normal circadian rhythm in the brain.  This helps restore the normal circadian rhythm.   HEADACHE DIET: Foods and beverages which may trigger migraine Note that only 20% of headache patients are food sensitive. You will know if you are food sensitive if you get a headache consistently 20 minutes to 2 hours after eating a certain food. Only cut out a food if it causes headaches, otherwise you might remove foods you enjoy! What matters most for diet is to eat a well balanced healthy diet full of vegetables and low fat protein, and to not miss meals.   Chocolate, other sweets ALL cheeses except cottage and cream cheese Dairy products, yogurt, sour cream, ice cream Liver Meat extracts (Bovril, Marmite, meat tenderizers) Meats or fish which have undergone aging, fermenting, pickling or smoking. These include: Hotdogs,salami,Lox,sausage, mortadellas,smoked salmon, pepperoni, Pickled herring Pods of broad bean (English beans, Chinese pea pods, [place] (fava) beans, lima and navy beans Ripe avocado, ripe banana Yeast extracts or active yeast preparations such as Brewer's or Fleishman's (commercial bakes goods are permitted) Tomato based foods, pizza (lasagna, etc.)   MSG (monosodium glutamate) is disguised as many things; look  for these common aliases: Monopotassium glutamate Autolysed yeast Hydrolysed protein Sodium caseinate "flavorings" "all natural preservatives" Nutrasweet   Avoid all other foods that convincingly provoke headaches.   Resources: The Dizzy Althia Jetty Your Headache Diet, migrainestrong.com  https://zamora-andrews.com/   Caffeine and Migraine For patients that have migraine, caffeine intake more than 3 days per week can lead to dependency and increased migraine frequency. I would recommend cutting back on your caffeine intake as best you can. The recommended amount of caffeine is 200-300 mg daily, although migraine patients may experience dependency at even lower doses. While you may notice an increase in headache temporarily, cutting back will be helpful for headaches in the long run. For more information on caffeine and migraine, visit: https://americanmigrainefoundation.org/resource-library/caffeine-and-migraine/   Headache Prevention Strategies:   1. Maintain a headache diary; learn to identify and avoid triggers.  - This can be a simple note where you log when you had a headache, associated symptoms, and medications used - There are several smartphone apps developed to help track migraines: Migraine Buddy, Migraine Monitor, Curelator N1-Headache App   Common triggers include: Emotional triggers: Emotional/Upset family or friends Emotional/Upset occupation Business reversal/success Anticipation anxiety Crisis-serious Post-crisis periodNew job/position   Physical triggers: Vacation Day Weekend Strenuous Exercise High Altitude Location New Move Menstrual Day Physical Illness Oversleep/Not enough  sleep Weather changes Light: Photophobia or light sesnitivity treatment involves a balance between desensitization and reduction in overly strong input. Use dark polarized glasses outside, but not inside. Avoid bright or fluorescent light,  but do not dim environment to the point that going into a normally lit room hurts. Consider FL-41 tint lenses, which reduce the most irritating wavelengths without blocking too much light.  These can be obtained at axonoptics.com or theraspecs.com Foods: see list above.   2. Limit use of acute treatments (over-the-counter medications, triptans, etc.) to no more than 2 days per week or 10 days per month to prevent medication overuse headache (rebound headache).     3. Follow a regular schedule (including weekends and holidays): Don't skip meals. Eat a balanced diet. 8 hours of sleep nightly. Minimize stress. Exercise 30 minutes per day. Being overweight is associated with a 5 times increased risk of chronic migraine. Keep well hydrated and drink 6-8 glasses of water per day.   4. Initiate non-pharmacologic measures at the earliest onset of your headache. Rest and quiet environment. Relax and reduce stress. Breathe2Relax is a free app that can instruct you on    some simple relaxtion and breathing techniques. Http://Dawnbuse.com is a    free website that provides teaching videos on relaxation.  Also, there are  many apps that   can be downloaded for "mindful" relaxation.  An app called YOGA NIDRA will help walk you through mindfulness. Another app called Calm can be downloaded to give you a structured mindfulness guide with daily reminders and skill development. Headspace for guided meditation Mindfulness Based Stress Reduction Online Course: www.palousemindfulness.com Cold compresses.   5. Don't wait!! Take the maximum allowable dosage of prescribed medication at the first sign of migraine.   6. Compliance:  Take prescribed medication regularly as directed and at the first sign of a migraine.   7. Communicate:  Call your physician when problems arise, especially if your headaches change, increase in frequency/severity, or become associated with neurological symptoms (weakness, numbness, slurred  speech, etc.). Proceed to emergency room if you experience new or worsening symptoms or symptoms do not resolve, if you have new neurologic symptoms or if headache is severe, or for any concerning symptom.   8. Headache/pain management therapies: Consider various complementary methods, including medication, behavioral therapy, psychological counselling, biofeedback, massage therapy, acupuncture, dry needling, and other modalities.  Such measures may reduce the need for medications. Counseling for pain management, where patients learn to function and ignore/minimize their pain, seems to work very well.   9. Recommend changing family's attention and focus away from patient's headaches. Instead, emphasize daily activities. If first question of day is 'How are your headaches/Do you have a headache today?', then patient will constantly think about headaches, thus making them worse. Goal is to re-direct attention away from headaches, toward daily activities and other distractions.   10. Helpful Websites: www.AmericanHeadacheSociety.org PatentHood.ch www.headaches.org TightMarket.nl www.achenet.org

## 2023-06-06 NOTE — Progress Notes (Signed)
 Chief Complaint  Patient presents with   Follow-up    Pt in room 1.alone.  Here for migraine follow up. Pt said migraines are doing great. Last migraine was about 2 months.     HISTORY OF PRESENT ILLNESS:  06/06/23 ALL:  Wendy Whitaker returns for follow up for migraines. She was last seen 09/2021 and doing well on amitriptyline  50mg  at bedtime and sumatriptan  as needed. Since, she reports migraines remain well managed. She has reduced amitriptyline  to 25mg  at bedtime. She may have 6-8 migraines a year. Sumatriptan  works great. She does have mild tension/pressure type headaches at least 20 days a month. Headache resolved shortly after waking. She denies feeling groggy in the mornings. She does not snore. Dad and sister have sleep apnea. BP is usually normal. She does not routinely check CBGs. Lat A1C 6.1.   09/11/2021 ALL:  Wendy Whitaker is a 58 y.o. female here today for follow up for migraines. She continues amitriptyline  50mg  QHS and sumatriptan  100mg  PRN. She discontinued Emgality  about 6-8 months ago due to cost. She feels that migraines are well managed. Maybe 1-2 per month. She was recently diagnosed with prediabetes. She is tolerating metformin well. Last A1C 5.8. She is watching her diet and exercising regularly.   She does note fairly constant itching of bilateral upper ext. Symptoms have been present for several years. Dermatology feels it is related to dry skin/eczema. She is unsure. She denies dry mouth or constipation.   09/20/20 ALL (Mychart): Wendy Whitaker continues to do very well. We swicthed Amovig to Emgality  and she reports headaches are very well managed. She continues amitriptyline  50mg  at bedtime and sumatriptan  as needed. She may use 9 tablets over 3-4 months. She feels that morning headaches are better (unsure if related to switch to Emgality ). PCP has been watching her closely for prediabetes. She is doing well, today and without concerns.    07/09/2019 ALL (Mychart):  Wendy Whitaker is a 58 y.o. female here today for follow up for migraines. She continues Amovig 140mg  monthly, amitriptyline  50mg  at bedtime for prevention and sumatriptan  for abortive therapy. Ubrelvy  did not help. Migraines continues fairly regularly. More severe at night. She has been waking up more frequently with headaches. She has at least 10 migraines per month. She is going through a full dose of sumatriptan . She does not snore. She does not wake frequently. She denies dry mouth in the mornings. She wears a mouth guard for TMJ. She sees her dentist regularly. BP are always normal. She denies changes in stress levels. She usually goes to sleep around 10:30-11. Wakes around 7:30. Mom and dad both have sleep apnea.    REVIEW OF SYSTEMS: Out of a complete 14 system review of symptoms, the patient complains only of the following symptoms, headaches, itchy skin and all other reviewed systems are negative.   ALLERGIES: No Known Allergies   HOME MEDICATIONS: Outpatient Medications Prior to Visit  Medication Sig Dispense Refill   levothyroxine (SYNTHROID, LEVOTHROID) 50 MCG tablet Take 88 mcg by mouth at bedtime.     metFORMIN (GLUCOPHAGE-XR) 500 MG 24 hr tablet Take 500 mg by mouth daily.     rosuvastatin (CRESTOR) 10 MG tablet Take 10 mg by mouth daily.     amitriptyline  (ELAVIL ) 25 MG tablet Take 25 mg by mouth at bedtime.     SUMAtriptan  (IMITREX ) 100 MG tablet Take 1 tab at the onset of migraine can repeat in 2 hours if needed. Not to exceed 2 tabs/24  hours. 27 tablet 2   amitriptyline  (ELAVIL ) 25 MG tablet Take 1-2 tablets (25-50 mg total) by mouth at bedtime. (Patient not taking: Reported on 06/06/2023) 180 tablet 0   No facility-administered medications prior to visit.     PAST MEDICAL HISTORY: Past Medical History:  Diagnosis Date   Chronic anemia    Hypothyroid    IUD (intrauterine device) in place 07/06/2013   placed to control menses and related HA   Melanoma (HCC) 06/06/2014   back    Migraine    07/2014 4 migraines   Prediabetes      PAST SURGICAL HISTORY: Past Surgical History:  Procedure Laterality Date   CESAREAN SECTION  1988   HAND SURGERY     x2   KNEE SURGERY Right    multiple x's   ROTATOR CUFF REPAIR Right 12/03/2017   SKIN SURGERY  06/2014   melanoma on back removed in dr office   steroid inj in L elbow       FAMILY HISTORY: Family History  Problem Relation Age of Onset   Migraines Son      SOCIAL HISTORY: Social History   Socioeconomic History   Marital status: Single    Spouse name: Jonelle Neri   Number of children: 3   Years of education: Pharmacist, community   Highest education level: Not on file  Occupational History    Employer: OTHER    Comment: n/a  Tobacco Use   Smoking status: Never   Smokeless tobacco: Never  Substance and Sexual Activity   Alcohol use: Yes    Comment: 1-2 alcohol drinks on the weekends   Drug use: Never   Sexual activity: Not on file  Other Topics Concern   Not on file  Social History Narrative   Patient lives at home with her spouse.   Caffeine Use: 1 cup 2-3 times weekly   Social Drivers of Health   Financial Resource Strain: Not on file  Food Insecurity: Not on file  Transportation Needs: Not on file  Physical Activity: Not on file  Stress: Not on file  Social Connections: Not on file  Intimate Partner Violence: Not on file     PHYSICAL EXAM  Vitals:   06/06/23 1245  BP: 111/72  Pulse: 63  Weight: 142 lb (64.4 kg)  Height: 5\' 3"  (1.6 m)    Body mass index is 25.15 kg/m.  Generalized: Well developed, in no acute distress  Cardiology: normal rate and rhythm, no murmur auscultated  Respiratory: clear to auscultation bilaterally    Neurological examination  Mentation: Alert oriented to time, place, history taking. Follows all commands speech and language fluent Cranial nerve II-XII: Pupils were equal round reactive to light. Extraocular movements were full, visual field were full on  confrontational test. Facial sensation and strength were normal. Head turning and shoulder shrug  were normal and symmetric. Motor: The motor testing reveals 5 over 5 strength of all 4 extremities. Good symmetric motor tone is noted throughout.  Gait and station: Gait is normal.    DIAGNOSTIC DATA (LABS, IMAGING, TESTING) - I reviewed patient records, labs, notes, testing and imaging myself where available.  Lab Results  Component Value Date   WBC 11.6 (H) 11/15/2018   HGB 12.0 11/15/2018   HCT 37.4 11/15/2018   MCV 85.8 11/15/2018   PLT 226 11/15/2018      Component Value Date/Time   NA 134 (L) 11/15/2018 1906   K 3.6 11/15/2018 1906   CL 99 11/15/2018 1906  CO2 23 11/15/2018 1906   GLUCOSE 142 (H) 11/15/2018 1906   BUN 19 11/15/2018 1906   CREATININE 1.05 (H) 11/15/2018 1906   CALCIUM 8.7 (L) 11/15/2018 1906   GFRNONAA 60 (L) 11/15/2018 1906   GFRAA >60 11/15/2018 1906   No results found for: "CHOL", "HDL", "LDLCALC", "LDLDIRECT", "TRIG", "CHOLHDL" No results found for: "HGBA1C" No results found for: "VITAMINB12" No results found for: "TSH"      No data to display               No data to display           ASSESSMENT AND PLAN  58 y.o. year old female  has a past medical history of Chronic anemia, Hypothyroid, IUD (intrauterine device) in place (07/06/2013), Melanoma (HCC) (06/06/2014), Migraine, and Prediabetes. here with    Migraine without aura and without status migrainosus, not intractable - Plan: SUMAtriptan  (IMITREX ) 100 MG tablet   Adali Oman is doing very well. Migraines remain well managed. We will decrease amitriptyline  dose to 20mg  daily. May reduce to 10 at bedtime in 4-6 weeks, if she wishes. amitriptyline . She will continue sumatriptan  as needed. Healthy lifestyle habits encouraged. Consider sleep eval if morning headaches do not improve. She will follow up with PCP as directed. She will return to see me in 1 year, sooner if needed. May  follow up with PCP if willing to fill medications. She verbalizes understanding and agreement with this plan.    No orders of the defined types were placed in this encounter.    Meds ordered this encounter  Medications   amitriptyline  (ELAVIL ) 10 MG tablet    Sig: Take 2 tablets (20 mg total) by mouth at bedtime.    Dispense:  180 tablet    Refill:  3    Supervising Provider:   AHERN, ANTONIA B [[id number]]   SUMAtriptan  (IMITREX ) 100 MG tablet    Sig: Take 1 tab at the onset of migraine can repeat in 2 hours if needed. Not to exceed 2 tabs/24 hours.    Dispense:  27 tablet    Refill:  2    Must last 90 days. 27 tabs per 90 days    Supervising Provider:   Glory Larsen [[id number]]     Terrilyn Fick, MSN, FNP-C 06/06/2023, 1:11 PM  [facility] [address], [address] [place], [place] [address] [phone]

## 2024-03-05 ENCOUNTER — Other Ambulatory Visit (HOSPITAL_BASED_OUTPATIENT_CLINIC_OR_DEPARTMENT_OTHER): Payer: Self-pay

## 2024-03-06 ENCOUNTER — Other Ambulatory Visit (HOSPITAL_BASED_OUTPATIENT_CLINIC_OR_DEPARTMENT_OTHER): Payer: Self-pay

## 2024-03-06 MED ORDER — CEPHALEXIN 500 MG PO CAPS: 1000.0000 mg | ORAL_CAPSULE | ORAL | 0 refills | Status: AC

## 2024-03-06 MED ORDER — LEVOTHYROXINE SODIUM 88 MCG PO TABS
ORAL_TABLET | ORAL | 1 refills | Status: AC
  Filled 2024-03-09: qty 120, 90d supply, fill #0

## 2024-03-06 MED ORDER — LEVOTHYROXINE SODIUM 88 MCG PO TABS: ORAL_TABLET | ORAL | 0 refills | Status: AC

## 2024-03-06 MED ORDER — ROSUVASTATIN CALCIUM 10 MG PO TABS
10.0000 mg | ORAL_TABLET | Freq: Every day | ORAL | 3 refills | Status: AC
  Filled 2024-03-09: qty 90, 90d supply, fill #0

## 2024-03-06 MED ORDER — METFORMIN HCL ER 500 MG PO TB24: 1000.0000 mg | ORAL_TABLET | Freq: Every evening | ORAL | 0 refills | Status: AC

## 2024-03-06 MED ORDER — CELECOXIB 200 MG PO CAPS: 200.0000 mg | ORAL_CAPSULE | Freq: Every day | ORAL | 1 refills | Status: AC | PRN

## 2024-03-09 ENCOUNTER — Other Ambulatory Visit (HOSPITAL_BASED_OUTPATIENT_CLINIC_OR_DEPARTMENT_OTHER): Payer: Self-pay
